# Patient Record
Sex: Female | Born: 1975 | Race: White | Hispanic: No | Marital: Married | Smoking: Former smoker
Health system: Southern US, Community
[De-identification: ages and names within clinical notes are randomized; demographics above are authoritative.]

## PROBLEM LIST (undated history)

## (undated) DIAGNOSIS — M1990 Unspecified osteoarthritis, unspecified site: Secondary | ICD-10-CM

## (undated) DIAGNOSIS — I951 Orthostatic hypotension: Secondary | ICD-10-CM

## (undated) DIAGNOSIS — F329 Major depressive disorder, single episode, unspecified: Secondary | ICD-10-CM

## (undated) DIAGNOSIS — F429 Obsessive-compulsive disorder, unspecified: Secondary | ICD-10-CM

## (undated) DIAGNOSIS — I498 Other specified cardiac arrhythmias: Secondary | ICD-10-CM

## (undated) DIAGNOSIS — F419 Anxiety disorder, unspecified: Secondary | ICD-10-CM

## (undated) DIAGNOSIS — R000 Tachycardia, unspecified: Secondary | ICD-10-CM

## (undated) DIAGNOSIS — T7840XA Allergy, unspecified, initial encounter: Secondary | ICD-10-CM

## (undated) DIAGNOSIS — J3081 Allergic rhinitis due to animal (cat) (dog) hair and dander: Secondary | ICD-10-CM

## (undated) DIAGNOSIS — F1020 Alcohol dependence, uncomplicated: Secondary | ICD-10-CM

## (undated) DIAGNOSIS — F1910 Other psychoactive substance abuse, uncomplicated: Secondary | ICD-10-CM

## (undated) DIAGNOSIS — F32A Depression, unspecified: Secondary | ICD-10-CM

## (undated) DIAGNOSIS — G90A Postural orthostatic tachycardia syndrome (POTS): Secondary | ICD-10-CM

## (undated) DIAGNOSIS — R011 Cardiac murmur, unspecified: Secondary | ICD-10-CM

## (undated) HISTORY — DX: Allergic rhinitis due to animal (cat) (dog) hair and dander: J30.81

## (undated) HISTORY — DX: Cardiac murmur, unspecified: R01.1

## (undated) HISTORY — DX: Depression, unspecified: F32.A

## (undated) HISTORY — DX: Alcohol dependence, uncomplicated: F10.20

## (undated) HISTORY — DX: Unspecified osteoarthritis, unspecified site: M19.90

## (undated) HISTORY — DX: Major depressive disorder, single episode, unspecified: F32.9

## (undated) HISTORY — PX: TONSILLECTOMY: SUR1361

## (undated) HISTORY — DX: Other psychoactive substance abuse, uncomplicated: F19.10

## (undated) HISTORY — DX: Allergy, unspecified, initial encounter: T78.40XA

---

## 2009-06-10 ENCOUNTER — Encounter: Payer: Self-pay | Admitting: Cardiology

## 2009-08-18 ENCOUNTER — Ambulatory Visit: Payer: Self-pay | Admitting: Cardiology

## 2009-08-18 DIAGNOSIS — R011 Cardiac murmur, unspecified: Secondary | ICD-10-CM

## 2009-08-18 HISTORY — DX: Cardiac murmur, unspecified: R01.1

## 2009-08-26 ENCOUNTER — Ambulatory Visit: Payer: Self-pay

## 2009-08-26 ENCOUNTER — Encounter: Payer: Self-pay | Admitting: Cardiology

## 2010-07-26 ENCOUNTER — Telehealth (INDEPENDENT_AMBULATORY_CARE_PROVIDER_SITE_OTHER): Payer: Self-pay | Admitting: *Deleted

## 2011-01-17 NOTE — Progress Notes (Signed)
  [employer] Request recieved, [employer] faxed this am. Erle Crocker  July 26, 2010 8:36 AM

## 2011-06-29 ENCOUNTER — Encounter (INDEPENDENT_AMBULATORY_CARE_PROVIDER_SITE_OTHER): Payer: BC Managed Care – PPO

## 2011-06-29 DIAGNOSIS — R002 Palpitations: Secondary | ICD-10-CM

## 2011-06-30 ENCOUNTER — Telehealth: Payer: Self-pay | Admitting: Cardiology

## 2011-06-30 NOTE — Telephone Encounter (Signed)
Pt needs to travel to [place] and is leaving on 7/18 and is wearing a monitor, advised I was not sure if it would work will check w/Ruth on Monday and call her back

## 2011-06-30 NOTE — Telephone Encounter (Signed)
Pt wants to know can she travel out of town with wearing the holter monitor

## 2011-07-03 NOTE — Telephone Encounter (Signed)
Left message again for pt to call back and that she should call the company about for monitor and whether it will work outside of the country

## 2011-07-03 NOTE — Telephone Encounter (Signed)
Left message for pt to call back to discuss.  She should call the monitor company to check with them.

## 2011-07-14 ENCOUNTER — Encounter: Payer: Self-pay | Admitting: Cardiology

## 2011-07-18 ENCOUNTER — Ambulatory Visit: Payer: Self-pay | Admitting: Cardiology

## 2011-07-19 ENCOUNTER — Encounter: Payer: Self-pay | Admitting: Cardiology

## 2011-07-19 ENCOUNTER — Ambulatory Visit (INDEPENDENT_AMBULATORY_CARE_PROVIDER_SITE_OTHER): Payer: BC Managed Care – PPO | Admitting: Cardiology

## 2011-07-19 VITALS — BP 110/77 | HR 74 | Ht 66.0 in | Wt 179.0 lb

## 2011-07-19 DIAGNOSIS — R011 Cardiac murmur, unspecified: Secondary | ICD-10-CM

## 2011-07-19 DIAGNOSIS — R55 Syncope and collapse: Secondary | ICD-10-CM

## 2011-07-19 NOTE — Patient Instructions (Signed)
Your physician recommends that you schedule a follow-up appointment with Dr Shirlee Latch after 07/26/2012 when monitor is complete--week of 8/13 or 8/20

## 2011-07-21 ENCOUNTER — Telehealth: Payer: Self-pay | Admitting: *Deleted

## 2011-07-21 NOTE — Progress Notes (Signed)
PCP: Dr. Riley Nearing  35 yo that I saw in the past for evaluation of cardiac murmur (had mitral valve prolapse but minimal mitral regurgitation) presents for followup after recent syncopal episode.  She also has palpitations.  Patient was standing in the aisle at [facility] a couple of weeks ago and began to feel severely lightheaded.  She passed out briefly and slumped to the ground.  She was admitted to the hospital in [facility].  Workup there was unremarkable with a relatively normal echo.  No arrhythmia but heart rate did drop into the 40s briefly.  She continues to feel her heart speed up and slow down seemingly at random.  Lightheadedness is associated with these palpitations. They can occur while she is seated or if she is standing: not clearly positional.  No vertigo-type symptoms.  She has had an event monitor on.  This has shown no significant arrhythmias.  When I checked orthostatics today, she was not orthostatic by blood pressure, but heart rate rose from 65 to 85 with standing and she felt palpitations.    Patient's exercise tolerance is good. She mountain bikes and is quite active.  No dyspnea or chest pain with exertion.    ECG:  NSR, normal  Allergies (verified):  1)  ! Amoxicillin 2)  ! * Naproxen 3)  ! * Shellfish 4)  ! Zithromax  Past Medical History: 1. Arthritis 2. Depression 3. Allergies 4. Cardiac murmur: Most recent echo in 7/12 at [facility] showed EF 60-65% with no significant valvular abnormalities.  5. Syncope 6. Possible POTS  Family History: No premature CAD.   Social History: Media planner  Tobacco Use - Former. Quit 09/2005 Alcohol Use - no Regular Exercise - yes, swimming, biking Hx of Drug Use in 1997  Full Time- Corporate Controller +caffeine Smoking Status:  quit Does Patient Exercise:  yes  Review of Systems        All systems reviewed and negative except as per HPI.    Current Outpatient Prescriptions  Medication Sig Dispense  Refill  . IBUPROFEN IB PO Take by mouth as needed.        . venlafaxine (EFFEXOR) 75 MG tablet Take 75 mg by mouth daily.          BP 110/77  Pulse 74  Ht 5\' 6"  (1.676 m)  Wt 179 lb (81.194 kg)  BMI 28.89 kg/m2 General: NAD Neck: No JVD, no thyromegaly or thyroid nodule.  Lungs: Clear to auscultation bilaterally with normal respiratory effort. CV: Nondisplaced PMI.  Heart regular S1/S2, no S3/S4, no murmur.  No peripheral edema.  No carotid bruit.  Normal pedal pulses.  Abdomen: Soft, nontender, no hepatosplenomegaly, no distention.  Neurologic: Alert and oriented x 3.  Psych: Normal affect. Extremities: No clubbing or cyanosis.

## 2011-07-21 NOTE — Telephone Encounter (Signed)
Dr Shirlee Latch received and reviewed [facility] End of Service Report enrollment period 06/29/11-06/30/11 no significant arrhythmia. Signed by Dr Shirlee Latch 07/03/11.

## 2011-07-23 DIAGNOSIS — R55 Syncope and collapse: Secondary | ICD-10-CM | POA: Insufficient documentation

## 2011-07-23 NOTE — Assessment & Plan Note (Signed)
Patient had a syncopal episode while standing in the grocery store and continues to have palpitations and lightheadedness that can occur while sitting or when standing.  Event monitor so far has shown no worrisome arrhythmias.  Echo was unremarkable.  Her heart rate did rise from 65=>85 with standing and she felt lightheaded though blood pressure did not fall.   Postural orthostatic tachycardia syndrome (POTS) is a definite possible cause for her symptoms.  I encouraged her to stay well-hydrated and to be liberal with salt use.  She has 2 weeks to go on the event monitor.  I will see her back after this is completed.

## 2011-07-23 NOTE — Assessment & Plan Note (Signed)
No significant murmur noted today.  Mild mitral valve prolapse seen on prior echo.

## 2011-08-09 ENCOUNTER — Ambulatory Visit (INDEPENDENT_AMBULATORY_CARE_PROVIDER_SITE_OTHER): Payer: BC Managed Care – PPO | Admitting: Cardiology

## 2011-08-09 ENCOUNTER — Encounter: Payer: Self-pay | Admitting: Cardiology

## 2011-08-09 VITALS — BP 112/75 | HR 74 | Resp 18 | Ht 66.0 in | Wt 180.1 lb

## 2011-08-09 DIAGNOSIS — R42 Dizziness and giddiness: Secondary | ICD-10-CM

## 2011-08-09 DIAGNOSIS — R002 Palpitations: Secondary | ICD-10-CM

## 2011-08-09 NOTE — Patient Instructions (Signed)
Lab is scheduled for 08/10/11 at 8:30am. This lab needs to be done in the morning. AM cortisol/TSH/Free T3/Free T4  785.1  Liberalize your salt intake.   Use compression stockings. You can get these at the pharmacy without a prescription.  Schedule an appointment to see Dr Shirlee Latch in 1 month.

## 2011-08-10 ENCOUNTER — Other Ambulatory Visit (INDEPENDENT_AMBULATORY_CARE_PROVIDER_SITE_OTHER): Payer: BC Managed Care – PPO | Admitting: *Deleted

## 2011-08-10 DIAGNOSIS — R002 Palpitations: Secondary | ICD-10-CM

## 2011-08-10 DIAGNOSIS — R42 Dizziness and giddiness: Secondary | ICD-10-CM | POA: Insufficient documentation

## 2011-08-10 HISTORY — DX: Dizziness and giddiness: R42

## 2011-08-10 LAB — CORTISOL: Cortisol, Plasma: 13.8 ug/dL

## 2011-08-10 NOTE — Progress Notes (Signed)
PCP: Dr. Riley Nearing  35 yo that I saw in the past for evaluation of cardiac murmur (had mitral valve prolapse but minimal mitral regurgitation) presents for followup after recent syncopal episode.  She also has palpitations.  Patient was standing in the aisle at [facility] several weeks ago and began to feel severely lightheaded.  She passed out briefly and slumped to the ground.  She was admitted to the hospital in [facility]  Workup there was unremarkable with a relatively normal echo.  No arrhythmia but heart rate did drop into the 40s briefly.  She continues to have "dizzy spells."  She is primarily lightheaded with standing.  She wore an event monitor that showed no significant arrhythmias.  When I checked orthostatics at last appointment, she was not orthostatic by blood pressure, but heart rate rose from 65 to 85 with standing and she felt palpitations.    Patient's exercise tolerance is good. She mountain bikes and is quite active.  No dyspnea or chest pain with exertion.    ECG:  NSR, normal  Allergies (verified):  1)  ! Amoxicillin 2)  ! * Naproxen 3)  ! * Shellfish 4)  ! Zithromax  Past Medical History: 1. Arthritis 2. Depression 3. Allergies 4. Cardiac murmur: Most recent echo in 7/12 at [facility] showed EF 60-65% with no significant valvular abnormalities.  5. Syncope 6. Possible POTS: HR rises with standing significantly with standing.  3 week event monitor in 7/12 was unremarkable.    Family History: No premature CAD.   Social History: Media planner  Tobacco Use - Former. Quit 09/2005 Alcohol Use - no Regular Exercise - yes, swimming, biking Hx of Drug Use in 1997  Full Time- Corporate Controller +caffeine Smoking Status:  quit Does Patient Exercise:  yes  Review of Systems        All systems reviewed and negative except as per HPI.    Current Outpatient Prescriptions  Medication Sig Dispense Refill  . IBUPROFEN IB PO Take by mouth as needed.         . venlafaxine (EFFEXOR) 75 MG tablet Take 75 mg by mouth daily.          BP 112/75  Pulse 74  Resp 18  Ht 5\' 6"  (1.676 m)  Wt 180 lb 1.9 oz (81.702 kg)  BMI 29.07 kg/m2 General: NAD Neck: No JVD, no thyromegaly or thyroid nodule.  Lungs: Clear to auscultation bilaterally with normal respiratory effort. CV: Nondisplaced PMI.  Heart regular S1/S2, no S3/S4, no murmur.  No peripheral edema.  No carotid bruit.  Normal pedal pulses.  Abdomen: Soft, nontender, no hepatosplenomegaly, no distention.  Neurologic: Alert and oriented x 3.  Psych: Normal affect. Extremities: No clubbing or cyanosis.

## 2011-08-10 NOTE — Assessment & Plan Note (Addendum)
Patient has lightheadedness and palpitations with standing.  HR rose about 20 bpm from lying to standing in the office without significant fall in BP.  Echo was unremarkable.  I suspect that she has postural tachycardia syndrome.  Prior to committing to this diagnosis, I will get an am cortisol, TSH, free T4, and free T3.  I would like her to start with liberalizing the sodium in her diet and using compression stockings.  I will see her back in a month.  If these steps have not helped her symptoms, I will consider trying fludrocortisone.

## 2011-08-14 ENCOUNTER — Telehealth: Payer: Self-pay | Admitting: Cardiology

## 2011-08-14 NOTE — Telephone Encounter (Signed)
I talked with pt about lab results done 08/10/11

## 2011-08-14 NOTE — Telephone Encounter (Signed)
Returning call back to nurse.  

## 2011-09-13 ENCOUNTER — Ambulatory Visit: Payer: BC Managed Care – PPO | Admitting: Cardiology

## 2011-09-18 ENCOUNTER — Encounter: Payer: Self-pay | Admitting: Cardiology

## 2011-12-01 ENCOUNTER — Ambulatory Visit (INDEPENDENT_AMBULATORY_CARE_PROVIDER_SITE_OTHER): Payer: BC Managed Care – PPO

## 2013-01-13 ENCOUNTER — Encounter: Payer: Self-pay | Admitting: *Deleted

## 2013-01-13 ENCOUNTER — Emergency Department (INDEPENDENT_AMBULATORY_CARE_PROVIDER_SITE_OTHER): Payer: BC Managed Care – PPO

## 2013-01-13 ENCOUNTER — Emergency Department (INDEPENDENT_AMBULATORY_CARE_PROVIDER_SITE_OTHER)
Admission: EM | Admit: 2013-01-13 | Discharge: 2013-01-13 | Disposition: A | Discharge status: Home / Self Care | Payer: Self-pay | Source: Home / Self Care | Attending: Family Medicine | Admitting: Family Medicine

## 2013-01-13 DIAGNOSIS — J0100 Acute maxillary sinusitis, unspecified: Secondary | ICD-10-CM

## 2013-01-13 DIAGNOSIS — J310 Chronic rhinitis: Secondary | ICD-10-CM

## 2013-01-13 DIAGNOSIS — R509 Fever, unspecified: Secondary | ICD-10-CM

## 2013-01-13 DIAGNOSIS — J3489 Other specified disorders of nose and nasal sinuses: Secondary | ICD-10-CM

## 2013-01-13 HISTORY — DX: Tachycardia, unspecified: R00.0

## 2013-01-13 HISTORY — DX: Orthostatic hypotension: I95.1

## 2013-01-13 HISTORY — DX: Postural orthostatic tachycardia syndrome (POTS): G90.A

## 2013-01-13 HISTORY — DX: Other specified cardiac arrhythmias: I49.8

## 2013-01-13 MED ORDER — CLINDAMYCIN HCL 300 MG PO CAPS: 300.0000 mg | ORAL_CAPSULE | Freq: Three times a day (TID) | ORAL | Status: DC

## 2013-01-13 MED ORDER — PREDNISONE 20 MG PO TABS: 20.0000 mg | ORAL_TABLET | Freq: Two times a day (BID) | ORAL | Status: DC

## 2013-01-13 NOTE — ED Notes (Signed)
Patient c/o sinus pain and congestion that she is having trouble getting to drain. Fatigue and body aches present at times. Taken Mucinex DM otc with minimal relief.

## 2013-01-13 NOTE — ED Provider Notes (Signed)
History     CSN: [id number]  Arrival date & time 01/13/13  4098   First MD Initiated Contact with Patient 01/13/13 1107      Chief Complaint  Patient presents with  . Sinus Problem      HPI Comments: Patient has a history of seasonal allergies and for about a month has had increased sneezing and head congestion.  One week ago she developed increased fatigue, myalgias, and frontal headache.  She has developed increased yellow sinus drainage, and has had chills for the past three days.  She has had no improvement from Mucinex DM, Afrin spray, and Advil. She states that she has Flonase at home but has not used it.  She notes that she is able to take clindamycin without adverse effect.  The history is provided by the patient.    Past Medical History  Diagnosis Date  . Arthritis   . Depressed   . Cat allergies   . Cardiac murmur   . POTS (postural orthostatic tachycardia syndrome)     Past Surgical History  Procedure Date  . Tonsillectomy     History reviewed. No pertinent family history.  History  Substance Use Topics  . Smoking status: Former Smoker    Quit date: 01/13/2003  . Smokeless tobacco: Current User    Types: Snuff     Comment: quit 10/06  . Alcohol Use: No    OB History    Grav Para Term Preterm Abortions TAB SAB Ect Mult Living                  Review of Systems No sore throat No cough No pleuritic pain No wheezing + nasal congestion + post-nasal drainage + sinus pain/pressure No itchy/red eyes No earache No hemoptysis No SOB No fever, + chills No nausea No vomiting No abdominal pain No diarrhea No urinary symptoms No skin rashes + fatigue No myalgias + headache   Allergies  Amoxicillin; Azithromycin; Naproxen; and Shellfish allergy  Home Medications   Current Outpatient Rx  Name  Route  Sig  Dispense  Refill  . CLINDAMYCIN HCL 300 MG PO CAPS   Oral   Take 1 capsule (300 mg total) by mouth 3 (three) times daily.   21  capsule   0   . IBUPROFEN IB PO   Oral   Take by mouth as needed.           Marland Kitchen PREDNISONE 20 MG PO TABS   Oral   Take 1 tablet (20 mg total) by mouth 2 (two) times daily.   10 tablet   0   . VENLAFAXINE HCL 75 MG PO TABS   Oral   Take 75 mg by mouth daily.             BP 121/80  Pulse 80  Temp 98.6 F (37 C) (Oral)  Resp 14  Ht 5\' 6"  (1.676 m)  Wt 207 lb (93.895 kg)  BMI 33.41 kg/m2  SpO2 98%  LMP 01/02/2013  Physical Exam Nursing notes and Vital Signs reviewed. Appearance:  Patient appears stated age, and in no acute distress.  Patient is obese (BMI 33.4) Eyes:  Pupils are equal, round, and reactive to light and accomodation.  Extraocular movement is intact.  Conjunctivae are not inflamed  Ears:  Canals normal.  Tympanic membranes normal.  Nose:  Moderately congested turbinates.  Bilateral maxillary sinus tenderness is present.  Pharynx:  Normal Neck:  Supple.  No adenopathy  Lungs:  Clear to auscultation.  Breath sounds are equal.  Heart:  Regular rate and rhythm without murmurs, rubs, or gallops.  Abdomen:  Nontender without masses or hepatosplenomegaly.  Bowel sounds are present.  No CVA or flank tenderness.  Extremities:  No edema.  No calf tenderness Skin:  No rash present.   ED Course  Procedures none    Dg Sinuses Complete  01/13/2013  *RADIOLOGY REPORT*  Clinical Data: Congestion, fever.  PARANASAL SINUSES - COMPLETE 3 + VIEW  Comparison: None.  Findings: No air fluid levels within the paranasal sinuses. Possible mucosal thickening within the maxillary sinuses bilaterally.  Visualized bony structures unremarkable.  IMPRESSION: Question maxillary mucoperiosteal thickening.  No air fluid levels.   Original Report Authenticated By: Charlett Nose, M.D.      1. Acute maxillary sinusitis   2. Rhinitis       MDM  Begin prednisone burst; Clindamycin for one week.  Begin Flonase (already has rx at home) Continue Mucinex D (guaifenesin with decongestant)  twice daily for congestion.  Increase fluid intake. May use Afrin nasal spray (or generic oxymetazoline) twice daily for about 5 days.  Also recommend using saline nasal spray several times daily and saline nasal irrigation (AYR is a common brand).  Use Flonase spray after Afrin and saline Stop all antihistamines for now, and other non-prescription cough/cold preparations. Followup with ENT if not improving        Lattie Haw, MD 01/17/13 1944

## 2013-05-15 ENCOUNTER — Emergency Department
Admission: EM | Admit: 2013-05-15 | Discharge: 2013-05-15 | Disposition: A | Discharge status: Home / Self Care | Payer: BC Managed Care – PPO | Source: Home / Self Care | Attending: Family Medicine | Admitting: Family Medicine

## 2013-05-15 ENCOUNTER — Encounter: Payer: Self-pay | Admitting: *Deleted

## 2013-05-15 DIAGNOSIS — J309 Allergic rhinitis, unspecified: Secondary | ICD-10-CM

## 2013-05-15 DIAGNOSIS — J069 Acute upper respiratory infection, unspecified: Secondary | ICD-10-CM

## 2013-05-15 DIAGNOSIS — J302 Other seasonal allergic rhinitis: Secondary | ICD-10-CM

## 2013-05-15 HISTORY — DX: Obsessive-compulsive disorder, unspecified: F42.9

## 2013-05-15 HISTORY — DX: Anxiety disorder, unspecified: F41.9

## 2013-05-15 MED ORDER — SULFAMETHOXAZOLE-TRIMETHOPRIM 800-160 MG PO TABS: 1.0000 | ORAL_TABLET | Freq: Two times a day (BID) | ORAL | Status: DC

## 2013-05-15 MED ORDER — FLUTICASONE PROPIONATE 50 MCG/ACT NA SUSP: NASAL | Status: DC

## 2013-05-15 MED ORDER — BENZONATATE 200 MG PO CAPS: 200.0000 mg | ORAL_CAPSULE | Freq: Every day | ORAL | Status: DC

## 2013-05-15 MED ORDER — PREDNISONE 20 MG PO TABS: 20.0000 mg | ORAL_TABLET | Freq: Two times a day (BID) | ORAL | Status: DC

## 2013-05-15 NOTE — ED Provider Notes (Signed)
History     CSN: [id number]  Arrival date & time 05/15/13  1310   First MD Initiated Contact with Patient 05/15/13 1340      Chief Complaint  Patient presents with  . Cough  . Generalized Body Aches  . Nasal Congestion       HPI Comments: Patient has a history of seasonal allergies.  3 days ago she developed fatigue, chills, and myalgias.  Last night the myalgias had improved, but this morning she developed increased sinus congestion not responding to Zyrtec.  She also noted a mild sore throat, and a slight cough today. Her seasonal rhinitis has improved in the past with Flonase nasal spray.  The history is provided by the patient.    Past Medical History  Diagnosis Date  . Arthritis   . Depressed   . Cat allergies   . Cardiac murmur   . POTS (postural orthostatic tachycardia syndrome)   . OCD (obsessive compulsive disorder)   . Anxiety     Past Surgical History  Procedure Laterality Date  . Tonsillectomy      Family History  Problem Relation Age of Onset  . Cancer Mother     skin    History  Substance Use Topics  . Smoking status: Former Smoker    Quit date: 01/13/2003  . Smokeless tobacco: Current User    Types: Snuff     Comment: quit 10/06  . Alcohol Use: No    OB History   Grav Para Term Preterm Abortions TAB SAB Ect Mult Living                  Review of Systems + mild sore throat each morning + cough today No pleuritic pain No wheezing + nasal congestion + post-nasal drainage + sinus pain/pressure No itchy/red eyes No earache No hemoptysis No SOB No fever, + chills No nausea No vomiting No abdominal pain No diarrhea No urinary symptoms No skin rashes + fatigue + myalgias No headache Used OTC meds without relief   Allergies  Amoxicillin; Azithromycin; Naproxen; and Shellfish allergy  Home Medications   Current Outpatient Rx  Name  Route  Sig  Dispense  Refill  . benzonatate (TESSALON) 200 MG capsule   Oral   Take 1  capsule (200 mg total) by mouth at bedtime.   12 capsule   0   . fluticasone (FLONASE) 50 MCG/ACT nasal spray      Place 2 sprays in each nostril once daily for allergy symptoms   16 g   2   . IBUPROFEN IB PO   Oral   Take by mouth as needed.           . predniSONE (DELTASONE) 20 MG tablet   Oral   Take 1 tablet (20 mg total) by mouth 2 (two) times daily.   10 tablet   0   . sulfamethoxazole-trimethoprim (BACTRIM DS,SEPTRA DS) 800-160 MG per tablet   Oral   Take 1 tablet by mouth 2 (two) times daily. (Rx void after 05/23/13)   14 tablet   0   . venlafaxine (EFFEXOR) 75 MG tablet   Oral   Take 75 mg by mouth daily.             BP 138/85  Pulse 91  Temp(Src) 97.9 F (36.6 C) (Oral)  Resp 18  Wt 200 lb (90.719 kg)  BMI 32.3 kg/m2  SpO2 100%  LMP 04/24/2013  Physical Exam Nursing notes and Vital Signs  reviewed. Appearance:  Patient appears stated age, and in no acute distress.  Patient is obese (BMI 32.3) Eyes:  Pupils are equal, round, and reactive to light and accomodation.  Extraocular movement is intact.  Conjunctivae are not inflamed  Ears:  Canals normal.  Tympanic membranes normal.  Nose:  Mildly congested turbinates.  No sinus tenderness.   Pharynx:  Normal Neck:  Supple.  Non-tender but prominent shotty posterior nodes are palpated bilaterally  Lungs:  Clear to auscultation.  Breath sounds are equal.  Heart:  Regular rate and rhythm without murmurs, rubs, or gallops.  Abdomen:  Nontender without masses or hepatosplenomegaly.  Bowel sounds are present.  No CVA or flank tenderness.  Extremities:  No edema.  Skin:  No rash present.   ED Course  Procedures  none      1. Acute upper respiratory infections of unspecified site; suspect early viral URI  2. Seasonal rhinitis       MDM  There is no evidence of bacterial infection today.   Begin Flonase nasal spray. Take plain Mucinex (guaifenesin) twice daily for cough and congestion.  Add Sudafed  as needed for sinus congestion.  Increase fluid intake, rest. May continue Afrin nasal spray (or generic oxymetazoline) twice daily for about 5 days.  Also recommend using saline nasal spray several times daily and saline nasal irrigation (AYR is a common brand).  Use Flonase spray after decongestant spray and saline rinse. Stop all antihistamines for now, and other non-prescription cough/cold preparations. Begin antibiotic if not improving about one week or if persistent fever develops (Given a prescription to hold, with an expiration date). Follow-up with family doctor if not improving about 10 days.        Lattie Haw, MD 05/15/13 [phone]

## 2013-05-15 NOTE — ED Notes (Signed)
Pt c/o body aches, chills, nasal congestion, and cough x 3 days. She has taken Advil, Mucinex and Alka Seltzer with no relief.

## 2013-05-29 ENCOUNTER — Encounter: Payer: Self-pay | Admitting: *Deleted

## 2013-05-29 ENCOUNTER — Emergency Department
Admission: EM | Admit: 2013-05-29 | Discharge: 2013-05-29 | Disposition: A | Discharge status: Home / Self Care | Payer: BC Managed Care – PPO | Source: Home / Self Care | Attending: Family Medicine | Admitting: Family Medicine

## 2013-05-29 DIAGNOSIS — W57XXXA Bitten or stung by nonvenomous insect and other nonvenomous arthropods, initial encounter: Secondary | ICD-10-CM

## 2013-05-29 DIAGNOSIS — R21 Rash and other nonspecific skin eruption: Secondary | ICD-10-CM

## 2013-05-29 DIAGNOSIS — T148 Other injury of unspecified body region: Secondary | ICD-10-CM

## 2013-05-29 MED ORDER — METHYLPREDNISOLONE SODIUM SUCC 125 MG IJ SOLR
125.0000 mg | Freq: Once | INTRAMUSCULAR | Status: AC
  Administered 2013-05-29: 125 mg via INTRAMUSCULAR

## 2013-05-29 MED ORDER — HYDROXYZINE HCL 25 MG PO TABS: 25.0000 mg | ORAL_TABLET | Freq: Three times a day (TID) | ORAL | Status: DC | PRN

## 2013-05-29 MED ORDER — DOXYCYCLINE HYCLATE 100 MG PO CAPS: ORAL_CAPSULE | ORAL | Status: DC

## 2013-05-29 MED ORDER — PREDNISONE 50 MG PO TABS: ORAL_TABLET | ORAL | Status: DC

## 2013-05-29 NOTE — ED Provider Notes (Addendum)
History     CSN: [id number]  Arrival date & time 05/29/13  1109   First MD Initiated Contact with Patient 05/29/13 1114      Chief Complaint  Patient presents with  . Insect Bite   HPI  HPI  This patient complains of a RASH  Location: L lower abdomen   Onset: yesterday evening  Course: Pt was riding bike on trail when she felt insect fly under her shirt and then sting her. Had localized area of redness that seemed to worsen. Went to [facility] where her sister works as a Engineer, civil (consulting). Gave her prednisone for treatment. Pt states this helped mildly. Pt states that when she woke, area was more red. Area has been intensily pruritic since initial sting. Minimal tenderness. Unsure of type of insect that caused sting. Denies any prior allergy to bees.   Self-treated with: prednisone as above   Improvement with treatment: mild   History  Itching: yes, intense   Tenderness: mild   New medications/antibiotics: no  Pet exposure: no  Recent travel or tropical exposure: no  New soaps, shampoos, detergent, clothing: no  Tick/insect exposure: no  Chemical Exposure: no  Red Flags  Feeling ill: no  Fever: no  Facial/tongue swelling/difficulty breathing: no  Diabetic or immunocompromised: no    Past Medical History  Diagnosis Date  . Arthritis   . Depressed   . Cat allergies   . Cardiac murmur   . POTS (postural orthostatic tachycardia syndrome)   . OCD (obsessive compulsive disorder)   . Anxiety     Past Surgical History  Procedure Laterality Date  . Tonsillectomy      Family History  Problem Relation Age of Onset  . Cancer Mother     skin    History  Substance Use Topics  . Smoking status: Former Smoker    Quit date: 01/13/2003  . Smokeless tobacco: Current User    Types: Snuff     Comment: quit 10/06  . Alcohol Use: No    OB History   Grav Para Term Preterm Abortions TAB SAB Ect Mult Living                  Review of Systems  All other systems reviewed and are  negative.    Allergies  Amoxicillin; Azithromycin; Naproxen; and Shellfish allergy  Home Medications   Current Outpatient Rx  Name  Route  Sig  Dispense  Refill  . fluticasone (FLONASE) 50 MCG/ACT nasal spray      Place 2 sprays in each nostril once daily for allergy symptoms   16 g   2   . IBUPROFEN IB PO   Oral   Take by mouth as needed.           . venlafaxine (EFFEXOR) 75 MG tablet   Oral   Take 75 mg by mouth daily.             BP 119/84  Pulse 84  Temp(Src) 98.3 F (36.8 C) (Oral)  Resp 14  Wt 199 lb (90.266 kg)  BMI 32.13 kg/m2  SpO2 98%  LMP 04/24/2013  Physical Exam  Constitutional: She appears well-developed and well-nourished.  HENT:  Head: Normocephalic and atraumatic.  Eyes: Conjunctivae are normal. Pupils are equal, round, and reactive to light.  Neck: Normal range of motion.  Cardiovascular: Normal rate, regular rhythm and normal heart sounds.   Pulmonary/Chest: Effort normal.  Abdominal: Soft.  Musculoskeletal: Normal range of motion.  Neurological: She is  alert.  Skin: Rash noted.    ED Course  Procedures (including critical care time)  Labs Reviewed - No data to display No results found.   1. Insect bite   2. Rash       MDM  Suspect secondary pruritic reaction from insect bite.  Will treat with solumedrol and prednisone.  Atarax for itching.  Start doxy if rash worsens despite treatment.  Discussed infectious and derm red flags.  Follow up as needed.     The patient and/or caregiver has been counseled thoroughly with regard to treatment plan and/or medications prescribed including dosage, schedule, interactions, rationale for use, and possible side effects and they verbalize understanding. Diagnoses and expected course of recovery discussed and will return if not improved as expected or if the condition worsens. Patient and/or caregiver verbalized understanding.             Doree Albee, MD 05/29/13  1146  Doree Albee, MD 05/29/13 1154

## 2013-05-29 NOTE — ED Notes (Signed)
Chelsea Wells reports being bitten by an insect last night @ 730pm on the left side of her abdomen. At 815pm she took one oral dose of prednisone. Site has surrounding redness.  She c/o being unable to take a deep breath but otherwise is asymptomatic.

## 2014-11-02 ENCOUNTER — Emergency Department (INDEPENDENT_AMBULATORY_CARE_PROVIDER_SITE_OTHER): Payer: BC Managed Care – PPO

## 2014-11-02 ENCOUNTER — Encounter: Payer: Self-pay | Admitting: Emergency Medicine

## 2014-11-02 ENCOUNTER — Emergency Department
Admission: EM | Admit: 2014-11-02 | Discharge: 2014-11-02 | Disposition: A | Discharge status: Home / Self Care | Payer: BC Managed Care – PPO | Source: Home / Self Care | Attending: Emergency Medicine | Admitting: Emergency Medicine

## 2014-11-02 DIAGNOSIS — S6720XA Crushing injury of unspecified hand, initial encounter: Secondary | ICD-10-CM

## 2014-11-02 DIAGNOSIS — T1490XA Injury, unspecified, initial encounter: Secondary | ICD-10-CM

## 2014-11-02 DIAGNOSIS — M79641 Pain in right hand: Secondary | ICD-10-CM

## 2014-11-02 NOTE — ED Notes (Signed)
Reports catching 3 fingers of right hand in car door last week; pain in index, middle and ring fingers has not resolved and desires xray.

## 2014-11-02 NOTE — ED Provider Notes (Signed)
CSN: [id number]     Arrival date & time 11/02/14  1720 History   First MD Initiated Contact with Patient 11/02/14 1750     Chief Complaint  Patient presents with  . Hand Pain   (Consider location/radiation/quality/duration/timing/severity/associated sxs/prior Treatment) Patient is a 38 y.o. female presenting with hand pain. The history is provided by the patient. No language interpreter was used.  Hand Pain This is a new problem. The current episode started today. The problem occurs constantly. The problem has been unchanged. Nothing aggravates the symptoms. She has tried nothing for the symptoms. The treatment provided moderate relief.  Pt complains  Of hitting hand with a door a week ago.  Pt reports she still has pain  Past Medical History  Diagnosis Date  . Arthritis   . Depressed   . Cat allergies   . Cardiac murmur   . POTS (postural orthostatic tachycardia syndrome)   . OCD (obsessive compulsive disorder)   . Anxiety    Past Surgical History  Procedure Laterality Date  . Tonsillectomy     Family History  Problem Relation Age of Onset  . Cancer Mother     skin   History  Substance Use Topics  . Smoking status: Former Smoker    Quit date: 01/13/2003  . Smokeless tobacco: Current User    Types: Snuff     Comment: quit 10/06  . Alcohol Use: No   OB History    No data available     Review of Systems  Skin: Positive for wound.  All other systems reviewed and are negative.   Allergies  Amoxicillin; Azithromycin; Naproxen; and Shellfish allergy  Home Medications   Prior to Admission medications   Medication Sig Start Date End Date Taking? Authorizing Provider  doxycycline (VIBRAMYCIN) 100 MG capsule 1 tab twice daily x 7 days. Use if rash worsens 05/29/13   Shanda Howells, MD  fluticasone [facility]) 50 MCG/ACT nasal spray Place 2 sprays in each nostril once daily for allergy symptoms 05/15/13   Kandra Nicolas, MD  hydrOXYzine (ATARAX/VISTARIL) 25 MG tablet Take  1 tablet (25 mg total) by mouth 3 (three) times daily as needed for itching. 05/29/13   Shanda Howells, MD  IBUPROFEN IB PO Take by mouth as needed.      Historical Provider, MD  predniSONE (DELTASONE) 50 MG tablet 1 tab daily x 4 days 05/29/13   Shanda Howells, MD  venlafaxine [facility]) 75 MG tablet Take 75 mg by mouth daily.      Historical Provider, MD   BP 102/70 mmHg  Pulse 92  Temp(Src) 97.8 F (36.6 C) (Oral)  Resp 16  Ht 5\' 6"  (1.676 m)  Wt 170 lb (77.111 kg)  BMI 27.45 kg/m2  SpO2 100% Physical Exam  Constitutional: She is oriented to person, place, and time. She appears well-developed and well-nourished.  HENT:  Head: Normocephalic.  Musculoskeletal: She exhibits tenderness.  From fingers,  Tender index middle and ring finger,  Slight swelling nv and ns intact  Neurological: She is alert and oriented to person, place, and time. She has normal reflexes.  Skin: Skin is warm.  Psychiatric: She has a normal mood and affect.  Nursing note and vitals reviewed.   ED Course  Procedures (including critical care time) Labs Review Labs Reviewed - No data to display  Imaging Review Dg Hand Complete Right  11/02/2014   CLINICAL DATA:  Injured right hand. Closed in a car door 2 weeks ago.  EXAM: RIGHT HAND -  COMPLETE 3+ VIEW  COMPARISON:  None.  FINDINGS: The joint spaces are maintained.  No acute fractures identified.  IMPRESSION: No acute bony findings.   Electronically Signed   By: Kalman Jewels M.D.   On: 11/02/2014 17:58     MDM   1. Crushing injury of hand and fingers, initial encounter   2. Injury     Pt counseled on results.  Advised of healing and crush injuries AVS   Fransico Meadow, PA-C 11/03/14 1643

## 2014-11-02 NOTE — Discharge Instructions (Signed)
Crush Injury, Fingers or Toes °A crush injury to the fingers or toes means the tissues have been damaged by being squeezed (compressed). There will be bleeding into the tissues and swelling. Often, blood will collect under the skin. When this happens, the skin on the finger often dies and may slough off (shed) 1 week to 10 days later. Usually, new skin is growing underneath. If the injury has been too severe and the tissue does not survive, the damaged tissue may begin to turn black over several days.  °Wounds which occur because of the crushing may be stitched (sutured) shut. However, crush injuries are more likely to become infected than other injuries. These wounds may not be closed as tightly as other types of cuts to prevent infection. Nails involved are often lost. These usually grow back over several weeks.  °DIAGNOSIS °X-rays may be taken to see if there is any injury to the bones. °TREATMENT °Broken bones (fractures) may be treated with splinting, depending on the fracture. Often, no treatment is required for fractures of the last bone in the fingers or toes. °HOME CARE INSTRUCTIONS  °· The crushed part should be raised (elevated) above the heart or center of the chest as much as possible for the first several days or as directed. This helps with pain and lessens swelling. Less swelling increases the chances that the crushed part will survive. °· Put ice on the injured area. °¨ Put ice in a plastic bag. °¨ Place a towel between your skin and the bag. °¨ Leave the ice on for 15-20 minutes, 03-04 times a day for the first 2 days. °· Only take over-the-counter or prescription medicines for pain, discomfort, or fever as directed by your caregiver. °· Use your injured part only as directed. °· Change your bandages (dressings) as directed. °· Keep all follow-up appointments as directed by your caregiver. Not keeping your appointment could result in a chronic or permanent injury, pain, and disability. If there is  any problem keeping the appointment, you must call to reschedule. °SEEK IMMEDIATE MEDICAL CARE IF:  °· There is redness, swelling, or increasing pain in the wound area. °· Pus is coming from the wound. °· You have a fever. °· You notice a bad smell coming from the wound or dressing. °· The edges of the wound do not stay together after the sutures have been removed. °· You are unable to move the injured finger or toe. °MAKE SURE YOU:  °· Understand these instructions. °· Will watch your condition. °· Will get help right away if you are not doing well or get worse. °Document Released: 12/04/2005 Document Revised: 02/26/2012 Document Reviewed: 04/21/2011 °[employer]® Patient Information ©2015 [employer]. This information is not intended to replace advice given to you by your health care provider. Make sure you discuss any questions you have with your health care provider. ° °

## 2014-11-25 ENCOUNTER — Emergency Department
Admission: EM | Admit: 2014-11-25 | Discharge: 2014-11-25 | Disposition: A | Discharge status: Home / Self Care | Payer: BC Managed Care – PPO | Source: Home / Self Care | Attending: Family Medicine | Admitting: Family Medicine

## 2014-11-25 ENCOUNTER — Encounter: Payer: Self-pay | Admitting: Emergency Medicine

## 2014-11-25 DIAGNOSIS — J309 Allergic rhinitis, unspecified: Secondary | ICD-10-CM

## 2014-11-25 DIAGNOSIS — B9789 Other viral agents as the cause of diseases classified elsewhere: Principal | ICD-10-CM

## 2014-11-25 DIAGNOSIS — J3089 Other allergic rhinitis: Secondary | ICD-10-CM

## 2014-11-25 DIAGNOSIS — J069 Acute upper respiratory infection, unspecified: Secondary | ICD-10-CM

## 2014-11-25 MED ORDER — BENZONATATE 200 MG PO CAPS: 200.0000 mg | ORAL_CAPSULE | Freq: Every day | ORAL | Status: DC

## 2014-11-25 MED ORDER — PREDNISONE 20 MG PO TABS: 20.0000 mg | ORAL_TABLET | Freq: Two times a day (BID) | ORAL | Status: DC

## 2014-11-25 MED ORDER — SULFAMETHOXAZOLE-TRIMETHOPRIM 800-160 MG PO TABS: 1.0000 | ORAL_TABLET | Freq: Two times a day (BID) | ORAL | Status: DC

## 2014-11-25 NOTE — Discharge Instructions (Signed)
Take plain Mucinex (1200 mg guaifenesin) twice daily for cough and congestion.  May add Sudafed for sinus congestion.   Increase fluid intake, rest. May use Afrin nasal spray (or generic oxymetazoline) twice daily for about 5 days.  Also recommend using saline nasal spray several times daily and saline nasal irrigation (AYR is a common brand).  Continue Flonase nasal spray as prescribed. Try warm salt water gargles for sore throat.  Stop all antihistamines for now, and other non-prescription cough/cold preparations. Begin Bactrim DS if not improving about one week or if persistent fever develops   Follow-up with family doctor if not improving about10 days.

## 2014-11-25 NOTE — ED Provider Notes (Signed)
CSN: [id number]     Arrival date & time 11/25/14  1614 History   First MD Initiated Contact with Patient 11/25/14 1628     Chief Complaint  Patient presents with  . Nasal Congestion  . Cough  . Generalized Body Aches  . Emesis      HPI Comments: Two days ago patient developed sinus congestion, myalgias, fatigue, mild sore throat, nausea/vomiting, fever to 102.1, and mild cough. She has a history of perennial allergic rhinitis and normally takes Zyrtec and Flonase nasal spray.  The history is provided by the patient.    Past Medical History  Diagnosis Date  . Arthritis   . Depressed   . Cat allergies   . Cardiac murmur   . POTS (postural orthostatic tachycardia syndrome)   . OCD (obsessive compulsive disorder)   . Anxiety    Past Surgical History  Procedure Laterality Date  . Tonsillectomy     Family History  Problem Relation Age of Onset  . Cancer Mother     skin   History  Substance Use Topics  . Smoking status: Former Smoker    Quit date: 01/13/2003  . Smokeless tobacco: Current User    Types: Snuff     Comment: quit 10/06  . Alcohol Use: No   OB History    No data available     Review of Systems + sore throat + cough No pleuritic pain No wheezing + nasal congestion + post-nasal drainage No sinus pain/pressure No itchy/red eyes No earache No hemoptysis No SOB + fever, + chills No nausea + vomiting, resolved No abdominal pain No diarrhea No urinary symptoms No skin rash + fatigue + myalgias No headache Used OTC meds without relief  Allergies  Amoxicillin; Azithromycin; Naproxen; and Shellfish allergy  Home Medications   Prior to Admission medications   Medication Sig Start Date End Date Taking? Authorizing Provider  benzonatate (TESSALON) 200 MG capsule Take 1 capsule (200 mg total) by mouth at bedtime. Take as needed for cough 11/25/14   Kandra Nicolas, MD  fluticasone [facility]) 50 MCG/ACT nasal spray Place 2 sprays in each nostril once  daily for allergy symptoms 05/15/13   Kandra Nicolas, MD  hydrOXYzine (ATARAX/VISTARIL) 25 MG tablet Take 1 tablet (25 mg total) by mouth 3 (three) times daily as needed for itching. 05/29/13   Shanda Howells, MD  IBUPROFEN IB PO Take by mouth as needed.      Historical Provider, MD  predniSONE (DELTASONE) 20 MG tablet Take 1 tablet (20 mg total) by mouth 2 (two) times daily. Take with food. 11/25/14   Kandra Nicolas, MD  sulfamethoxazole-trimethoprim (BACTRIM DS,SEPTRA DS) 800-160 MG per tablet Take 1 tablet by mouth 2 (two) times daily. (Rx void after 12/03/14) 11/25/14   Kandra Nicolas, MD  venlafaxine (EFFEXOR) 75 MG tablet Take 75 mg by mouth daily.      Historical Provider, MD   BP 114/82 mmHg  Pulse 97  Temp(Src) 98 F (36.7 C) (Oral)  Resp 16  Ht 5\' 6"  (1.676 m)  Wt 170 lb (77.111 kg)  BMI 27.45 kg/m2  SpO2 99% Physical Exam Nursing notes and Vital Signs reviewed. Appearance:  Patient appears healthy, stated age, and in no acute distress Eyes:  Pupils are equal, round, and reactive to light and accomodation.  Extraocular movement is intact.  Conjunctivae are not inflamed  Ears:  Canals normal.  Tympanic membranes normal.  Nose:  Congested turbinates.  No sinus tenderness.  Pharynx:  Normal Neck:  Supple.  Nontender enlarged posterior nodes are palpated bilaterally  Lungs:  Clear to auscultation.  Breath sounds are equal.  Heart:  Regular rate and rhythm without murmurs, rubs, or gallops.  Abdomen:  Nontender without masses or hepatosplenomegaly.  Bowel sounds are present.  No CVA or flank tenderness.  Extremities:  No edema.  No calf tenderness Skin:  No rash present.   ED Course  Procedures  none   MDM   1. Viral URI with cough   2. Perennial allergic rhinitis    There is no evidence of bacterial infection today.  Treat symptomatically for now  Prescription written for Benzonatate (Tessalon) to take at bedtime for night-time cough.  Begin prednisone burst. Take plain  Mucinex (1200 mg guaifenesin) twice daily for cough and congestion.  May add Sudafed for sinus congestion.   Increase fluid intake, rest. May use Afrin nasal spray (or generic oxymetazoline) twice daily for about 5 days.  Also recommend using saline nasal spray several times daily and saline nasal irrigation (AYR is a common brand).  Continue Flonase nasal spray as prescribed. Try warm salt water gargles for sore throat.  Stop all antihistamines for now, and other non-prescription cough/cold preparations. Begin Bactrim DS if not improving about one week or if persistent fever develops (Given a prescription to hold, with an expiration date)  Follow-up with family doctor if not improving about10 days.     Kandra Nicolas, MD 11/27/14 [id number]

## 2014-11-25 NOTE — ED Notes (Signed)
Reports onset of congestion, cough, ache, sore throat and occasional vomiting 3 days ago.

## 2016-04-25 ENCOUNTER — Emergency Department
Admission: EM | Admit: 2016-04-25 | Discharge: 2016-04-25 | Disposition: A | Discharge status: Home / Self Care | Payer: BLUE CROSS/BLUE SHIELD | Source: Home / Self Care | Attending: Emergency Medicine | Admitting: Emergency Medicine

## 2016-04-25 ENCOUNTER — Encounter: Payer: Self-pay | Admitting: *Deleted

## 2016-04-25 DIAGNOSIS — J0120 Acute ethmoidal sinusitis, unspecified: Secondary | ICD-10-CM | POA: Diagnosis not present

## 2016-04-25 MED ORDER — CLINDAMYCIN HCL 150 MG PO CAPS: 150.0000 mg | ORAL_CAPSULE | Freq: Four times a day (QID) | ORAL | Status: DC

## 2016-04-25 MED ORDER — PREDNISONE 10 MG PO TABS: ORAL_TABLET | ORAL | Status: DC

## 2016-04-25 NOTE — Discharge Instructions (Signed)

## 2016-04-25 NOTE — ED Provider Notes (Signed)
CSN: [id number]     Arrival date & time 04/25/16  1334 History   First MD Initiated Contact with Patient 04/25/16 1407     Chief Complaint  Patient presents with  . Facial Pain  . Sinus Problem   (Consider location/radiation/quality/duration/timing/severity/associated sxs/prior Treatment) Patient is a 40 y.o. female presenting with sinus complaint. The history is provided by the patient. No language interpreter was used.  Sinus Problem This is a new problem. Episode onset: 4 days. The problem occurs constantly. The problem has been gradually worsening. Associated symptoms include headaches. Nothing aggravates the symptoms. Nothing relieves the symptoms. She has tried nothing for the symptoms. The treatment provided no relief.  Pt complains of sinus congestion and facial pain.    Past Medical History  Diagnosis Date  . Arthritis   . Depressed   . Cat allergies   . Cardiac murmur   . POTS (postural orthostatic tachycardia syndrome)   . OCD (obsessive compulsive disorder)   . Anxiety    Past Surgical History  Procedure Laterality Date  . Tonsillectomy     Family History  Problem Relation Age of Onset  . Cancer Mother     skin   Social History  Substance Use Topics  . Smoking status: Former Smoker    Quit date: 01/13/2003  . Smokeless tobacco: Current User    Types: Snuff     Comment: quit 10/06  . Alcohol Use: No   OB History    No data available     Review of Systems  Neurological: Positive for headaches.  All other systems reviewed and are negative.   Allergies  Amoxicillin; Azithromycin; Naproxen; and Shellfish allergy  Home Medications   Prior to Admission medications   Medication Sig Start Date End Date Taking? Authorizing Provider  clindamycin (CLEOCIN) 150 MG capsule Take 1 capsule (150 mg total) by mouth every 6 (six) hours. 04/25/16   Fransico Meadow, PA-C  fluticasone [facility]) 50 MCG/ACT nasal spray Place 2 sprays in each nostril once daily for allergy  symptoms 05/15/13   Kandra Nicolas, MD  IBUPROFEN IB PO Take by mouth as needed.      Historical Provider, MD  predniSONE (DELTASONE) 10 MG tablet 6,5,4,3,2,1 taper 04/25/16   Fransico Meadow, PA-C  venlafaxine (EFFEXOR) 75 MG tablet Take 75 mg by mouth daily.      Historical Provider, MD   Meds Ordered and Administered this Visit  Medications - No data to display  BP 117/79 mmHg  Pulse 94  Temp(Src) 98.4 F (36.9 C) (Oral)  Wt 193 lb (87.544 kg)  SpO2 96%  LMP 04/18/2016 No data found.   Physical Exam  Constitutional: She is oriented to person, place, and time. She appears well-developed and well-nourished.  HENT:  Head: Normocephalic and atraumatic.  Nose: Nose normal.  Mouth/Throat: Posterior oropharyngeal erythema present.  Tender maxillary sinuses,  Eyes: Conjunctivae and EOM are normal. Pupils are equal, round, and reactive to light.  Neck: Normal range of motion. Neck supple.  Cardiovascular: Normal rate.   Pulmonary/Chest: Effort normal.  Abdominal: Soft.  Musculoskeletal: Normal range of motion.  Neurological: She is alert and oriented to person, place, and time.  Skin: Skin is warm and dry.  Psychiatric: She has a normal mood and affect.    ED Course  Procedures (including critical care time)  Labs Review Labs Reviewed - No data to display  Imaging Review No results found.   Visual Acuity Review  Right Eye Distance:  Left Eye Distance:   Bilateral Distance:    Right Eye Near:   Left Eye Near:    Bilateral Near:         MDM  Pt reports she has a history of sinusitis.  Pt reports she normally takes clindamycin.  Pt has multiple allergies.  Pt also reports normally requires prednisone.     1. Acute ethmoidal sinusitis, recurrence not specified    Meds ordered this encounter  Medications  . DISCONTD: clindamycin (CLEOCIN) 150 MG capsule    Sig: Take 1 capsule (150 mg total) by mouth every 6 (six) hours.    Dispense:  28 capsule    Refill:  0     Order Specific Question:  Supervising Provider    Answer:  Burnett Harry, DAVID [[id number]]  . DISCONTD: predniSONE (DELTASONE) 10 MG tablet    Sig: 6,5,4,3,2,1 taper    Dispense:  21 tablet    Refill:  0    Order Specific Question:  Supervising Provider    AnswerBurnett Harry, DAVID [[id number]]  . predniSONE (DELTASONE) 10 MG tablet    Sig: 6,5,4,3,2,1 taper    Dispense:  21 tablet    Refill:  0    Order Specific Question:  Supervising Provider    AnswerBurnett Harry, DAVID [[id number]]  . clindamycin (CLEOCIN) 150 MG capsule    Sig: Take 1 capsule (150 mg total) by mouth every 6 (six) hours.    Dispense:  28 capsule    Refill:  0    Order Specific Question:  Supervising Provider    Answer:  Burnett Harry, DAVID [[id number]]  An After Visit Summary was printed and given to the patient.    [place], PA-C 04/25/16 1456

## 2016-04-25 NOTE — ED Notes (Signed)
Pt c/o 4 days of facial pain, nasal congestion and HA. Afebrile but c/o some aches. Taken Sudafed and IBF.

## 2017-11-28 ENCOUNTER — Encounter (HOSPITAL_BASED_OUTPATIENT_CLINIC_OR_DEPARTMENT_OTHER): Payer: Self-pay

## 2017-11-28 ENCOUNTER — Emergency Department (HOSPITAL_BASED_OUTPATIENT_CLINIC_OR_DEPARTMENT_OTHER): Payer: BLUE CROSS/BLUE SHIELD

## 2017-11-28 ENCOUNTER — Emergency Department (HOSPITAL_BASED_OUTPATIENT_CLINIC_OR_DEPARTMENT_OTHER)
Admission: EM | Admit: 2017-11-28 | Discharge: 2017-11-28 | Disposition: A | Discharge status: Home / Self Care | Payer: BLUE CROSS/BLUE SHIELD | Attending: Emergency Medicine | Admitting: Emergency Medicine

## 2017-11-28 ENCOUNTER — Other Ambulatory Visit: Payer: Self-pay

## 2017-11-28 DIAGNOSIS — F17220 Nicotine dependence, chewing tobacco, uncomplicated: Secondary | ICD-10-CM | POA: Diagnosis not present

## 2017-11-28 DIAGNOSIS — R51 Headache: Secondary | ICD-10-CM | POA: Diagnosis not present

## 2017-11-28 DIAGNOSIS — Z79899 Other long term (current) drug therapy: Secondary | ICD-10-CM | POA: Insufficient documentation

## 2017-11-28 DIAGNOSIS — R519 Headache, unspecified: Secondary | ICD-10-CM

## 2017-11-28 DIAGNOSIS — H538 Other visual disturbances: Secondary | ICD-10-CM | POA: Diagnosis present

## 2017-11-28 DIAGNOSIS — F17200 Nicotine dependence, unspecified, uncomplicated: Secondary | ICD-10-CM | POA: Diagnosis not present

## 2017-11-28 MED ORDER — METOCLOPRAMIDE HCL 5 MG/ML IJ SOLN
10.0000 mg | Freq: Once | INTRAMUSCULAR | Status: AC
  Administered 2017-11-28: 10 mg via INTRAVENOUS
  Filled 2017-11-28: qty 2

## 2017-11-28 MED ORDER — DEXAMETHASONE SODIUM PHOSPHATE 10 MG/ML IJ SOLN
10.0000 mg | Freq: Once | INTRAMUSCULAR | Status: AC
  Administered 2017-11-28: 10 mg via INTRAVENOUS
  Filled 2017-11-28: qty 1

## 2017-11-28 MED ORDER — DIPHENHYDRAMINE HCL 50 MG/ML IJ SOLN
25.0000 mg | Freq: Once | INTRAMUSCULAR | Status: AC
  Administered 2017-11-28: 25 mg via INTRAVENOUS
  Filled 2017-11-28: qty 1

## 2017-11-28 MED ORDER — SODIUM CHLORIDE 0.9 % IV BOLUS (SEPSIS)
1000.0000 mL | Freq: Once | INTRAVENOUS | Status: AC
  Administered 2017-11-28: 1000 mL via INTRAVENOUS

## 2017-11-28 NOTE — ED Triage Notes (Signed)
Pt states she at [facility] when had sudden onset blurred vision then started seeing flashes-HA started with vision changes-NAD-steady gait

## 2017-11-28 NOTE — ED Provider Notes (Signed)
[facility] EMERGENCY DEPARTMENT Provider Note   CSN: [id number] Arrival date & time: 11/28/17  1428     History   Chief Complaint Chief Complaint  Patient presents with  . Visual Field Change    HPI Chelsea Wells is a 41 y.o. female.  Chelsea Wells is a 41 y.o. Female who presents to the ED complaining of visual changes starting today.  Patient reports she was at the [facility] and after leaving she had blurry vision to both of her eyes.  She reports this lasted for about 3 minutes and then her blurry vision improved to only having blurry peripheral vision.  She reports it seems there is like a halo around her vision and she can see clearly looking forward but on the periphery she has blurry vision.  No double vision.  She reports she is starting to develop a headache.  She does not wear contacts or glasses.  She denies any head injury or trauma.  She reports this is happened once before and resolved spontaneously.  She has had no black or double vision.  She denies eye pain or redness. She denies fevers, neck pain, changes to her balance, double vision, sore throat, chest pain, shortness of breath, abdominal pain, nausea, vomiting or head injury.   The history is provided by the patient and medical records. No language interpreter was used.    Past Medical History:  Diagnosis Date  . Anxiety   . Arthritis   . Cardiac murmur   . Cat allergies   . Depressed   . OCD (obsessive compulsive disorder)   . POTS (postural orthostatic tachycardia syndrome)     Patient Active Problem List   Diagnosis Date Noted  . Lightheadedness 08/10/2011  . Syncope 07/23/2011  . CARDIAC MURMUR 08/18/2009    Past Surgical History:  Procedure Laterality Date  . TONSILLECTOMY      OB History    No data available       Home Medications    Prior to Admission medications   Medication Sig Start Date End Date Taking? Authorizing Provider  escitalopram (LEXAPRO) 20 MG tablet Take  20 mg by mouth daily.   Yes [provider]  lurasidone (LATUDA) 80 MG TABS tablet Take 80 mg by mouth daily with breakfast.   Yes [provider]    Family History Family History  Problem Relation Age of Onset  . Cancer Mother        skin    Social History Social History   Tobacco Use  . Smoking status: Current Every Day Smoker  . Smokeless tobacco: Current User    Types: Chew  . Tobacco comment: quit 10/06  Substance Use Topics  . Alcohol use: No  . Drug use: No     Allergies   Amoxicillin; Azithromycin; Naproxen; and Shellfish allergy   Review of Systems Review of Systems  Constitutional: Negative for chills and fever.  HENT: Negative for congestion and sore throat.   Eyes: Positive for visual disturbance. Negative for photophobia, pain, discharge and redness.  Respiratory: Negative for cough and shortness of breath.   Cardiovascular: Negative for chest pain.  Gastrointestinal: Negative for abdominal pain, diarrhea, nausea and vomiting.  Genitourinary: Negative for dysuria.  Musculoskeletal: Negative for neck pain.  Skin: Negative for rash.  Neurological: Positive for headaches. Negative for dizziness, syncope, weakness, light-headedness and numbness.     Physical Exam Updated Vital Signs BP (!) 140/93 (BP Location: Right Arm)   Pulse 78  Temp 97.7 F (36.5 C) (Oral)   Resp 18   Ht 5\' 7"  (1.702 m)   Wt 90.7 kg (200 lb)   LMP 11/13/2017   SpO2 98%   BMI 31.32 kg/m   Physical Exam  Constitutional: She is oriented to person, place, and time. She appears well-developed and well-nourished. No distress.  Nontoxic appearing.  HENT:  Head: Normocephalic and atraumatic.  Right Ear: External ear normal.  Left Ear: External ear normal.  Mouth/Throat: Oropharynx is clear and moist. No oropharyngeal exudate.  Bilateral tympanic membranes are pearly-gray without erythema or loss of landmarks.   Eyes: Conjunctivae and EOM are normal. Pupils  are equal, round, and reactive to light. Right eye exhibits no discharge. Left eye exhibits no discharge.  EOMs are intact.  No conjunctival injection.  She reports she can see my fingers moving on peripheral vision exam, but they are blurry.  Central vision bilaterally is normal.  Neck: Normal range of motion. Neck supple. No JVD present. No tracheal deviation present.  Cardiovascular: Normal rate, regular rhythm, normal heart sounds and intact distal pulses. Exam reveals no gallop and no friction rub.  No murmur heard. Pulmonary/Chest: Effort normal and breath sounds normal. No respiratory distress. She has no wheezes. She has no rales.  Abdominal: Soft. There is no tenderness.  Musculoskeletal: She exhibits no edema.  Lymphadenopathy:    She has no cervical adenopathy.  Neurological: She is alert and oriented to person, place, and time. No cranial nerve deficit or sensory deficit. She exhibits normal muscle tone. Coordination normal.  Patient is alert and oriented 3. Speech is clear and coherent. Cranial nerves are intact. Sensation and strength is intact to bilateral upper and lower extremities.  Speech is clear and coherent.  No pronator drift.  Finger to nose intact bilaterally.  She reports blurry peripheral vision bilaterally.   EOMs are intact.  Skin: Skin is warm and dry. Capillary refill takes less than 2 seconds. No rash noted. She is not diaphoretic. No erythema. No pallor.  Psychiatric: She has a normal mood and affect. Her behavior is normal.  Nursing note and vitals reviewed.    ED Treatments / Results  Labs (all labs ordered are listed, but only abnormal results are displayed) Labs Reviewed - No data to display  EKG  EKG Interpretation None       Radiology Ct Head Wo Contrast  Result Date: 11/28/2017 CLINICAL DATA:  Headache, visual loss. EXAM: CT HEAD WITHOUT CONTRAST TECHNIQUE: Contiguous axial images were obtained from the base of the skull through the vertex  without intravenous contrast. COMPARISON:  None. FINDINGS: Brain: No evidence of acute infarction, hemorrhage, hydrocephalus, extra-axial collection or mass lesion/mass effect. Vascular: No hyperdense vessel or unexpected calcification. Skull: Normal. Negative for fracture or focal lesion. Sinuses/Orbits: No acute finding. Other: None. IMPRESSION: Normal head CT. Electronically Signed   By: Marijo Conception, M.D.   On: 11/28/2017 15:56    Procedures Procedures (including critical care time)    Visual Acuity  Right Eye Distance: 20/30(Uncorrected) Left Eye Distance: 20/20(Uncorrected) Bilateral Distance: 20/25(Uncorrected)  Right Eye Near:   Left Eye Near:    Bilateral Near:      Medications Ordered in ED Medications  sodium chloride 0.9 % bolus 1,000 mL (1,000 mLs Intravenous New Bag/Given 11/28/17 1600)  metoCLOPramide (REGLAN) injection 10 mg (10 mg Intravenous Given 11/28/17 1601)  diphenhydrAMINE (BENADRYL) injection 25 mg (25 mg Intravenous Given 11/28/17 1601)  dexamethasone (DECADRON) injection 10 mg (10 mg Intravenous  Given 11/28/17 1601)     Initial Impression / Assessment and Plan / ED Course  I have reviewed the triage vital signs and the nursing notes.  Pertinent labs & imaging results that were available during my care of the patient were reviewed by me and considered in my medical decision making (see chart for details).    This is a 41 y.o. Female who presents to the ED complaining of visual changes starting today.  Patient reports she was at the [facility] and after leaving she had blurry vision to both of her eyes.  She reports this lasted for about 3 minutes and then her blurry vision improved to only having blurry peripheral vision.  She reports it seems there is like a halo around her vision and she can see clearly looking forward but on the periphery she has blurry vision.  No double vision.  She reports she is starting to develop a headache.  She does not wear contacts or  glasses.  She denies any head injury or trauma.  She reports this is happened once before and resolved spontaneously.  She has had no black or double vision.  She denies eye pain or redness. On exam the patient is afebrile and not toxic appearing.  She reports blurry peripheral vision bilaterally.  No other visual changes noted.  She otherwise has no focal neurological deficits.  EOMs are intact.  Normal gait.  Will obtain CT head and provide with headache cocktail and reevaluate.  CT head without contrast shows no acute findings. At reevaluation following headache cocktail patient reports her symptoms are improving.  She reports her headache is resolving and she no longer has peripheral blurry vision.  She reports she feels like she has more blurry vision upwards bilaterally.  She reports she is feeling better and that her vision is improving.  We will discharge at this time with [facility] neurology. We discussed this could be an ocular migraine or a pituitary lesion. She agrees with plan for neurology follow up. Return precautions discussed. I advised the patient to follow-up with their primary care provider this week. I advised the patient to return to the emergency department with new or worsening symptoms or new concerns. The patient verbalized understanding and agreement with plan.   This patient was discussed with and evaluated by Dr. Alvino Chapel who agrees with assessment and plan.   Final Clinical Impressions(s) / ED Diagnoses   Final diagnoses:  Blurry vision, bilateral  Bad headache    ED Discharge Orders    None       Sharmaine Base 11/28/17 1633    Davonna Belling, MD 11/29/17 [phone]

## 2018-10-22 ENCOUNTER — Emergency Department (INDEPENDENT_AMBULATORY_CARE_PROVIDER_SITE_OTHER)
Admission: EM | Admit: 2018-10-22 | Discharge: 2018-10-22 | Disposition: A | Discharge status: Home / Self Care | Payer: BLUE CROSS/BLUE SHIELD | Source: Home / Self Care | Attending: Family Medicine | Admitting: Family Medicine

## 2018-10-22 ENCOUNTER — Encounter: Payer: Self-pay | Admitting: *Deleted

## 2018-10-22 DIAGNOSIS — F418 Other specified anxiety disorders: Secondary | ICD-10-CM | POA: Diagnosis not present

## 2018-10-22 DIAGNOSIS — K580 Irritable bowel syndrome with diarrhea: Secondary | ICD-10-CM

## 2018-10-22 MED ORDER — DICYCLOMINE HCL 10 MG PO CAPS: ORAL_CAPSULE | ORAL | 0 refills | Status: DC

## 2018-10-22 MED ORDER — LORAZEPAM 0.5 MG PO TABS: ORAL_TABLET | ORAL | 0 refills | Status: DC

## 2018-10-22 NOTE — ED Triage Notes (Signed)
Pt states current work leader has instructed her to do something that is "unethical and illegal". She has resigned and is now in a legal dispute with company. She has been unable to sleep, has diarrhea, headaches, restlessness, and tremors that she is r/t her work situation.

## 2018-10-22 NOTE — ED Provider Notes (Signed)
[facility]    CSN: [id number] Arrival date & time: 10/22/18  0947     History   Chief Complaint Chief Complaint  Patient presents with  . Stress    HPI Chelsea Wells is a 42 y.o. female.   Patient has been experiencing increased stress in her job during the past four days as the result of her employer requesting her to perform "unethical and illegal" acts.  She resigned two days ago and has hired an Forensic psychologist for guidance.  She remains exceedingly stressed because her company is demanding that she work an additional two weeks. During the past two days she has had poor sleep, decreased concentration, recurrent headache, nausea, diarrhea, and occasional vomiting.  She notes that her hands were "shaking" yesterday.  She states that her headache is partly improved with ibuprofen 200mg . She has a history of anxiety controlled with Lexapro and Latuda, and she has had occasional IBS symptoms in the past when stressed.  She does not feel depressed.  She reports that she has an initial appointment with Dr. Aundria Mems on 11/04/18.  The history is provided by the patient.  Anxiety  This is a recurrent problem. The current episode started more than 2 days ago. The problem occurs constantly. The problem has been gradually worsening. Associated symptoms include headaches. Pertinent negatives include no chest pain and no abdominal pain. The symptoms are aggravated by stress. Nothing relieves the symptoms. Treatments tried: Ibprofen. The treatment provided mild relief.    Past Medical History:  Diagnosis Date  . Anxiety   . Arthritis   . Cardiac murmur   . Cat allergies   . Depressed   . OCD (obsessive compulsive disorder)   . POTS (postural orthostatic tachycardia syndrome)     Patient Active Problem List   Diagnosis Date Noted  . Lightheadedness 08/10/2011  . Syncope 07/23/2011  . CARDIAC MURMUR 08/18/2009    Past Surgical History:  Procedure Laterality Date    . TONSILLECTOMY      OB History   None      Home Medications    Prior to Admission medications   Medication Sig Start Date End Date Taking? Authorizing Provider  escitalopram (LEXAPRO) 20 MG tablet Take 20 mg by mouth daily.   Yes [provider]  lurasidone (LATUDA) 80 MG TABS tablet Take 80 mg by mouth daily with breakfast.   Yes [provider]  dicyclomine (BENTYL) 10 MG capsule Take one tab PO, 2 or 3 times daily as needed for nausea, cramps, and diarrhea 10/22/18   Kandra Nicolas, MD  LORazepam (ATIVAN) 0.5 MG tablet Take one tab PO once or twice daily as needed for stress and anxiety 10/22/18   Kandra Nicolas, MD    Family History Family History  Problem Relation Age of Onset  . Cancer Mother        skin    Social History Social History   Tobacco Use  . Smoking status: Current Every Day Smoker  . Smokeless tobacco: Current User    Types: Chew  . Tobacco comment: quit 10/06  Substance Use Topics  . Alcohol use: No  . Drug use: No     Allergies   Amoxicillin; Azithromycin; Naproxen; and Shellfish allergy   Review of Systems Review of Systems  Constitutional: Positive for appetite change and fatigue. Negative for activity change, chills, diaphoresis and fever.  HENT: Negative.   Eyes: Negative.   Respiratory: Negative.   Cardiovascular: Negative.  Negative  for chest pain.  Gastrointestinal: Positive for diarrhea, nausea and vomiting. Negative for abdominal pain.  Genitourinary: Negative.   Musculoskeletal: Negative.   Neurological: Positive for headaches. Negative for dizziness, facial asymmetry, speech difficulty, light-headedness and numbness.  Psychiatric/Behavioral: Positive for decreased concentration and sleep disturbance. Negative for agitation, confusion, dysphoric mood, self-injury and suicidal ideas. The patient is nervous/anxious. The patient is not hyperactive.      Physical Exam Triage Vital Signs ED Triage Vitals   Enc Vitals Group     BP 10/22/18 1013 119/78     Pulse Rate 10/22/18 1013 80     Resp 10/22/18 1013 16     Temp 10/22/18 1013 98.4 F (36.9 C)     Temp Source 10/22/18 1013 Oral     SpO2 10/22/18 1013 98 %     Weight 10/22/18 1014 196 lb (88.9 kg)     Height 10/22/18 1014 5\' 6"  (1.676 m)     Head Circumference --      Peak Flow --      Pain Score 10/22/18 1013 0     Pain Loc --      Pain Edu? --      Excl. in [place]? --    No data found.  Updated Vital Signs BP 119/78 (BP Location: Right Arm)   Pulse 80   Temp 98.4 F (36.9 C) (Oral)   Resp 16   Ht 5\' 6"  (1.676 m)   Wt 88.9 kg   SpO2 98%   BMI 31.64 kg/m   Visual Acuity Right Eye Distance:   Left Eye Distance:   Bilateral Distance:    Right Eye Near:   Left Eye Near:    Bilateral Near:     Physical Exam Nursing notes and Vital Signs reviewed. Appearance:  Patient appears stated age, and in no acute distress.   Psychiatric:  Patient is alert and oriented with good eye contact.  Thoughts are organized.  No psychomotor retardation.  Memory intact.  Not suicidal.  Mildly depressed mood, normal affect.  Eyes:  Pupils are equal, round, and reactive to light and accomodation.  Extraocular movement is intact.  Conjunctivae are not inflamed   Pharynx:  Normal; moist mucous membranes  Neck:  Supple.  No adenopathy or adenopathy. Lungs:  Clear to auscultation.  Breath sounds are equal.  Moving air well. Heart:  Regular rate and rhythm without murmurs, rubs, or gallops.  Abdomen:  Nontender without masses or hepatosplenomegaly.  Extremities:  No edema.      UC Treatments / Results  Labs (all labs ordered are listed, but only abnormal results are displayed) Labs Reviewed - No data to display  EKG None  Radiology No results found.  Procedures Procedures (including critical care time)  Medications Ordered in UC Medications - No data to display  Initial Impression / Assessment and Plan / UC Course  I have reviewed  the triage vital signs and the nursing notes.  Pertinent labs & imaging results that were available during my care of the patient were reviewed by me and considered in my medical decision making (see chart for details).    Continue Lexapro and Latuda as prescribed. For headaches, may increase Ibuprofen 200mg  to 4 tabs every 8 hours with food.  Begin a short term trial of low dose lorazepam and Bentyl (#20 of each with no refill) Controlled Substance Prescriptions I have consulted the [place] Controlled Substances Registry for this patient, and feel the risk/benefit ratio today is favorable for  proceeding with this prescription for a controlled substance.  Followup with Dr. Aundria Mems on 11/04/18 as scheduled.  Final Clinical Impressions(s) / UC Diagnoses   Final diagnoses:  Situational anxiety  Irritable bowel syndrome with diarrhea   Discharge Instructions   None    ED Prescriptions    Medication Sig Dispense Auth. Provider   LORazepam (ATIVAN) 0.5 MG tablet Take one tab PO once or twice daily as needed for stress and anxiety 20 tablet Kandra Nicolas, MD   dicyclomine (BENTYL) 10 MG capsule Take one tab PO, 2 or 3 times daily as needed for nausea, cramps, and diarrhea 20 capsule Kandra Nicolas, MD         Kandra Nicolas, MD 10/22/18 1122

## 2018-10-29 ENCOUNTER — Emergency Department (HOSPITAL_BASED_OUTPATIENT_CLINIC_OR_DEPARTMENT_OTHER)
Admission: EM | Admit: 2018-10-29 | Discharge: 2018-10-29 | Disposition: A | Discharge status: Home / Self Care | Payer: BLUE CROSS/BLUE SHIELD | Attending: Emergency Medicine | Admitting: Emergency Medicine

## 2018-10-29 ENCOUNTER — Encounter (HOSPITAL_BASED_OUTPATIENT_CLINIC_OR_DEPARTMENT_OTHER): Payer: Self-pay | Admitting: *Deleted

## 2018-10-29 ENCOUNTER — Other Ambulatory Visit: Payer: Self-pay

## 2018-10-29 DIAGNOSIS — F17210 Nicotine dependence, cigarettes, uncomplicated: Secondary | ICD-10-CM | POA: Insufficient documentation

## 2018-10-29 DIAGNOSIS — Z23 Encounter for immunization: Secondary | ICD-10-CM | POA: Diagnosis not present

## 2018-10-29 DIAGNOSIS — Z2914 Encounter for prophylactic rabies immune globin: Secondary | ICD-10-CM | POA: Insufficient documentation

## 2018-10-29 DIAGNOSIS — S61431A Puncture wound without foreign body of right hand, initial encounter: Secondary | ICD-10-CM | POA: Diagnosis not present

## 2018-10-29 DIAGNOSIS — Y939 Activity, unspecified: Secondary | ICD-10-CM | POA: Insufficient documentation

## 2018-10-29 DIAGNOSIS — Y929 Unspecified place or not applicable: Secondary | ICD-10-CM | POA: Insufficient documentation

## 2018-10-29 DIAGNOSIS — W540XXA Bitten by dog, initial encounter: Secondary | ICD-10-CM | POA: Diagnosis not present

## 2018-10-29 DIAGNOSIS — Z79899 Other long term (current) drug therapy: Secondary | ICD-10-CM | POA: Insufficient documentation

## 2018-10-29 DIAGNOSIS — Z1231 Encounter for screening mammogram for malignant neoplasm of breast: Secondary | ICD-10-CM

## 2018-10-29 DIAGNOSIS — Y999 Unspecified external cause status: Secondary | ICD-10-CM | POA: Insufficient documentation

## 2018-10-29 LAB — PREGNANCY, URINE: PREG TEST UR: NEGATIVE

## 2018-10-29 MED ORDER — CLINDAMYCIN HCL 150 MG PO CAPS: 300.0000 mg | ORAL_CAPSULE | Freq: Three times a day (TID) | ORAL | 0 refills | Status: DC

## 2018-10-29 MED ORDER — FLUCONAZOLE 150 MG PO TABS: 150.0000 mg | ORAL_TABLET | Freq: Once | ORAL | 0 refills | Status: AC

## 2018-10-29 MED ORDER — TETANUS-DIPHTH-ACELL PERTUSSIS 5-2.5-18.5 LF-MCG/0.5 IM SUSP
0.5000 mL | Freq: Once | INTRAMUSCULAR | Status: AC
  Administered 2018-10-29: 0.5 mL via INTRAMUSCULAR
  Filled 2018-10-29: qty 0.5

## 2018-10-29 MED ORDER — RABIES IMMUNE GLOBULIN 150 UNIT/ML IM INJ
20.0000 [IU]/kg | INJECTION | Freq: Once | INTRAMUSCULAR | Status: AC
  Administered 2018-10-29: 1800 [IU] via INTRAMUSCULAR
  Filled 2018-10-29: qty 12

## 2018-10-29 MED ORDER — CLINDAMYCIN HCL 150 MG PO CAPS
300.0000 mg | ORAL_CAPSULE | Freq: Once | ORAL | Status: AC
  Administered 2018-10-29: 300 mg via ORAL
  Filled 2018-10-29: qty 2

## 2018-10-29 MED ORDER — DOXYCYCLINE HYCLATE 100 MG PO CAPS: 100.0000 mg | ORAL_CAPSULE | Freq: Two times a day (BID) | ORAL | 0 refills | Status: DC

## 2018-10-29 MED ORDER — RABIES VACCINE, PCEC IM SUSR
1.0000 mL | Freq: Once | INTRAMUSCULAR | Status: AC
  Administered 2018-10-29: 1 mL via INTRAMUSCULAR
  Filled 2018-10-29: qty 1

## 2018-10-29 MED ORDER — DOXYCYCLINE HYCLATE 100 MG PO TABS
100.0000 mg | ORAL_TABLET | Freq: Once | ORAL | Status: AC
  Administered 2018-10-29: 100 mg via ORAL
  Filled 2018-10-29: qty 1

## 2018-10-29 NOTE — ED Triage Notes (Signed)
pt c/o dog bite to right hand x 3 days ago , stray dog , puncture wound x 2

## 2018-10-29 NOTE — ED Provider Notes (Signed)
[facility] EMERGENCY DEPARTMENT Provider Note   CSN: [id number] Arrival date & time: 10/29/18  1540     History   Chief Complaint Chief Complaint  Patient presents with  . Animal Bite    HPI Chelsea Wells is a 42 y.o. female.  42 yo F with a chief complaint of a dog bite.  This happened about 3 days ago.  She said it better and then ran away.  She did not think much of it until it started having some increased pain and swelling.  She went to urgent care and they sent her here for evaluation.  She denies fevers or chills denies other area of injury.  The history is provided by the patient.  Animal Bite  Contact animal:  Dog Location:  Hand Hand injury location:  [place] of R hand Time since incident:  3 days Pain details:    Quality:  Aching and burning   Severity:  Mild   Timing:  Constant   Progression:  Worsening Incident location:  Outside Provoked: unprovoked   Notifications:  None Animal's rabies vaccination status:  Never received Animal in possession: no   Tetanus status:  Unknown Relieved by:  Nothing Worsened by:  Nothing Ineffective treatments:  None tried Associated symptoms: no fever     Past Medical History:  Diagnosis Date  . Anxiety   . Arthritis   . Cardiac murmur   . Cat allergies   . Depressed   . OCD (obsessive compulsive disorder)   . POTS (postural orthostatic tachycardia syndrome)     Patient Active Problem List   Diagnosis Date Noted  . Lightheadedness 08/10/2011  . Syncope 07/23/2011  . CARDIAC MURMUR 08/18/2009    Past Surgical History:  Procedure Laterality Date  . TONSILLECTOMY       OB History   None      Home Medications    Prior to Admission medications   Medication Sig Start Date End Date Taking? Authorizing Provider  clindamycin (CLEOCIN) 150 MG capsule Take 2 capsules (300 mg total) by mouth 3 (three) times daily for 10 days. 10/29/18 11/08/18  Deno Etienne, DO  dicyclomine (BENTYL) 10 MG  capsule Take one tab PO, 2 or 3 times daily as needed for nausea, cramps, and diarrhea 10/22/18   Kandra Nicolas, MD  doxycycline (VIBRAMYCIN) 100 MG capsule Take 1 capsule (100 mg total) by mouth 2 (two) times daily for 10 days. One po bid x 7 days 10/29/18 11/08/18  Deno Etienne, DO  escitalopram (LEXAPRO) 20 MG tablet Take 20 mg by mouth daily.    [provider]  fluconazole (DIFLUCAN) 150 MG tablet Take 1 tablet (150 mg total) by mouth once for 1 dose. 10/29/18 10/29/18  Deno Etienne, DO  lurasidone (LATUDA) 80 MG TABS tablet Take 80 mg by mouth daily with breakfast.    [provider]    Family History Family History  Problem Relation Age of Onset  . Cancer Mother        skin    Social History Social History   Tobacco Use  . Smoking status: Current Every Day Smoker    Packs/day: 0.50  . Smokeless tobacco: Current User    Types: Chew  . Tobacco comment: quit 10/06  Substance Use Topics  . Alcohol use: No  . Drug use: No     Allergies   Amoxicillin; Azithromycin; Naproxen; and Shellfish allergy   Review of Systems Review of Systems  Constitutional: Negative for chills  and fever.  HENT: Negative for congestion and rhinorrhea.   Eyes: Negative for redness and visual disturbance.  Respiratory: Negative for shortness of breath and wheezing.   Cardiovascular: Negative for chest pain and palpitations.  Gastrointestinal: Negative for nausea and vomiting.  Genitourinary: Negative for dysuria and urgency.  Musculoskeletal: Negative for arthralgias and myalgias.  Skin: Positive for color change and wound. Negative for pallor.  Neurological: Negative for dizziness and headaches.     Physical Exam Updated Vital Signs BP 106/72   Pulse 76   Temp 98.2 F (36.8 C)   Resp 16   Ht 5\' 6"  (1.676 m)   Wt 88.9 kg   LMP 10/03/2018   SpO2 100%   BMI 31.64 kg/m   Physical Exam  Constitutional: She is oriented to person, place, and time. She appears  well-developed and well-nourished. No distress.  HENT:  Head: Normocephalic and atraumatic.  Eyes: Pupils are equal, round, and reactive to light. EOM are normal.  Neck: Normal range of motion. Neck supple.  Cardiovascular: Normal rate and regular rhythm. Exam reveals no gallop and no friction rub.  No murmur heard. Pulmonary/Chest: Effort normal. She has no wheezes. She has no rales.  Abdominal: Soft. She exhibits no distension. There is no tenderness.  Musculoskeletal: She exhibits edema. She exhibits no tenderness.  2 small puncture wounds to the dorsum of the right hand.  Mild erythema and edema.  No extending redness beyond the wrist.  Mildly warm to the touch.  No area of fluctuance or induration.  Neurological: She is alert and oriented to person, place, and time.  Skin: Skin is warm and dry. She is not diaphoretic.  Psychiatric: She has a normal mood and affect. Her behavior is normal.  Nursing note and vitals reviewed.    ED Treatments / Results  Labs (all labs ordered are listed, but only abnormal results are displayed) Labs Reviewed  PREGNANCY, URINE    EKG None  Radiology No results found.  Procedures Procedures (including critical care time)  Medications Ordered in ED Medications  rabies immune globulin (HYPERAB/KEDRAB) injection 1,800 Units (1,800 Units Intramuscular Given 10/29/18 1616)  rabies vaccine (RABAVERT) injection 1 mL (1 mL Intramuscular Given 10/29/18 1614)  Tdap (BOOSTRIX) injection 0.5 mL (0.5 mLs Intramuscular Given 10/29/18 1612)  doxycycline (VIBRA-TABS) tablet 100 mg (100 mg Oral Given 10/29/18 1624)  clindamycin (CLEOCIN) capsule 300 mg (300 mg Oral Given 10/29/18 1624)     Initial Impression / Assessment and Plan / ED Course  I have reviewed the triage vital signs and the nursing notes.  Pertinent labs & imaging results that were available during my care of the patient were reviewed by me and considered in my medical decision making  (see chart for details).     42 yo F with a chief complaint of being bit by dog.  Unfortunately this was a couple days ago and she did not reported to animal control.  At this point I feel the most conservative route would be to start her on rabies vaccination.  Up to date recommends dual therapy for cellulitis treatment as she is allergic to amox.   4:40 PM:  I have discussed the diagnosis/risks/treatment options with the patient and believe the pt to be eligible for discharge home to follow-up with PCP. We also discussed returning to the ED immediately if new or worsening sx occur. We discussed the sx which are most concerning (e.g., sudden worsening pain, fever, inability to tolerate by mouth) that necessitate  immediate return. Medications administered to the patient during their visit and any new prescriptions provided to the patient are listed below.  Medications given during this visit Medications  rabies immune globulin (HYPERAB/KEDRAB) injection 1,800 Units (1,800 Units Intramuscular Given 10/29/18 1616)  rabies vaccine (RABAVERT) injection 1 mL (1 mL Intramuscular Given 10/29/18 1614)  Tdap (BOOSTRIX) injection 0.5 mL (0.5 mLs Intramuscular Given 10/29/18 1612)  doxycycline (VIBRA-TABS) tablet 100 mg (100 mg Oral Given 10/29/18 1624)  clindamycin (CLEOCIN) capsule 300 mg (300 mg Oral Given 10/29/18 1624)      The patient appears reasonably screen and/or stabilized for discharge and I doubt any other medical condition or other [facility] requiring further screening, evaluation, or treatment in the ED at this time prior to discharge.   Final Clinical Impressions(s) / ED Diagnoses   Final diagnoses:  Dog bite, initial encounter    ED Discharge Orders         Ordered    clindamycin (CLEOCIN) 150 MG capsule  3 times daily     10/29/18 1606    doxycycline (VIBRAMYCIN) 100 MG capsule  2 times daily     10/29/18 1606    fluconazole (DIFLUCAN) 150 MG tablet   Once     10/29/18 [place], Montrail Mehrer, DO 10/29/18 1640

## 2018-10-29 NOTE — ED Notes (Signed)
ED Provider at bedside. 

## 2018-10-29 NOTE — Discharge Instructions (Signed)
Follow up with the urgent care center for the next doses of your rabies vaccine.  Return to the ED for rapid spreading redness, fever.

## 2018-10-31 ENCOUNTER — Encounter: Payer: Self-pay | Admitting: Emergency Medicine

## 2018-10-31 ENCOUNTER — Emergency Department (INDEPENDENT_AMBULATORY_CARE_PROVIDER_SITE_OTHER)
Admission: EM | Admit: 2018-10-31 | Discharge: 2018-10-31 | Disposition: A | Discharge status: Home / Self Care | Payer: BLUE CROSS/BLUE SHIELD | Source: Home / Self Care

## 2018-10-31 DIAGNOSIS — Z203 Contact with and (suspected) exposure to rabies: Secondary | ICD-10-CM | POA: Diagnosis not present

## 2018-10-31 DIAGNOSIS — Z23 Encounter for immunization: Secondary | ICD-10-CM | POA: Diagnosis not present

## 2018-10-31 DIAGNOSIS — T148XXA Other injury of unspecified body region, initial encounter: Secondary | ICD-10-CM

## 2018-10-31 MED ORDER — RABIES VACCINE, PCEC IM SUSR
1.0000 mL | Freq: Once | INTRAMUSCULAR | Status: AC
  Administered 2018-10-31: 1 mL via INTRAMUSCULAR

## 2018-10-31 NOTE — ED Triage Notes (Signed)
Rabies shot, Rt Deltoid

## 2018-11-04 ENCOUNTER — Ambulatory Visit (HOSPITAL_BASED_OUTPATIENT_CLINIC_OR_DEPARTMENT_OTHER): Payer: BLUE CROSS/BLUE SHIELD

## 2018-11-04 ENCOUNTER — Encounter: Payer: Self-pay | Admitting: Physician Assistant

## 2018-11-04 ENCOUNTER — Ambulatory Visit (INDEPENDENT_AMBULATORY_CARE_PROVIDER_SITE_OTHER): Payer: BLUE CROSS/BLUE SHIELD | Admitting: Physician Assistant

## 2018-11-04 ENCOUNTER — Other Ambulatory Visit: Payer: Self-pay | Admitting: Physician Assistant

## 2018-11-04 VITALS — BP 107/73 | HR 74 | Ht 66.0 in | Wt 194.0 lb

## 2018-11-04 DIAGNOSIS — F1021 Alcohol dependence, in remission: Secondary | ICD-10-CM | POA: Insufficient documentation

## 2018-11-04 DIAGNOSIS — Z7689 Persons encountering health services in other specified circumstances: Secondary | ICD-10-CM

## 2018-11-04 DIAGNOSIS — Z91013 Allergy to seafood: Secondary | ICD-10-CM

## 2018-11-04 DIAGNOSIS — F17220 Nicotine dependence, chewing tobacco, uncomplicated: Secondary | ICD-10-CM

## 2018-11-04 DIAGNOSIS — F3175 Bipolar disorder, in partial remission, most recent episode depressed: Secondary | ICD-10-CM

## 2018-11-04 DIAGNOSIS — Z1211 Encounter for screening for malignant neoplasm of colon: Secondary | ICD-10-CM

## 2018-11-04 DIAGNOSIS — Z800 Family history of malignant neoplasm of digestive organs: Secondary | ICD-10-CM

## 2018-11-04 DIAGNOSIS — Z13220 Encounter for screening for lipoid disorders: Secondary | ICD-10-CM

## 2018-11-04 DIAGNOSIS — Z131 Encounter for screening for diabetes mellitus: Secondary | ICD-10-CM

## 2018-11-04 DIAGNOSIS — Z1231 Encounter for screening mammogram for malignant neoplasm of breast: Secondary | ICD-10-CM

## 2018-11-04 DIAGNOSIS — Z130 Encounter for screening for diseases of the blood and blood-forming organs and certain disorders involving the immune mechanism: Secondary | ICD-10-CM

## 2018-11-04 HISTORY — DX: Bipolar disorder, in partial remission, most recent episode depressed: F31.75

## 2018-11-04 HISTORY — DX: Family history of malignant neoplasm of digestive organs: Z80.0

## 2018-11-04 HISTORY — DX: Nicotine dependence, chewing tobacco, uncomplicated: F17.220

## 2018-11-04 HISTORY — DX: Allergy to seafood: Z91.013

## 2018-11-04 HISTORY — DX: Alcohol dependence, in remission: F10.21

## 2018-11-04 NOTE — Patient Instructions (Addendum)
I have also ordered fasting labs. The lab is a walk-in open M-F 7:30a-4:30p (closed 12:30-1:30p). Nothing to eat or drink after midnight or at least 8 hours before your blood draw. You can have water and your medications.     

## 2018-11-04 NOTE — Progress Notes (Signed)
HPI:                                                                Chelsea Wells is a 43 y.o. female who presents to [facility]: [facility] today to establish care  Current concerns: stress/anxiety  Currently taking Latuda 80 mg and Lexapro 20 mg for bipolar depression and reports she can have obsessive behaviors when she is stressed out. She states she has not had a bipolar episode in several years. She has been stable on her medications for the last 2 years. Reports her psychiatrist has moved her practice to [place] and she would like me to take over her prescriptions. 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 She has alcohol use disorder and has been sober for 1.5 years. She attends AA daily. She states she is very well supported by her sponsor and grand-sponsor. She is followed by a counselor in [place]. Because of her history of alcohol abuse, she would like to avoid all controlled substances, including benzodiazepines.  Currrently chews 1 can per day and smokes 1/2 ppd of cigarettes. She has tried patches and gum in the past.  Also reports mother was diagnosed with colon cancer in her early 34's.  Depression screen PHQ 2/9 11/04/2018  Decreased Interest 0  Down, Depressed, Hopeless 0  PHQ - 2 Score 0    GAD 7 : Generalized Anxiety Score 11/04/2018  Nervous, Anxious, on Edge 1  Control/stop worrying 1  Worry too much - different things 1  Trouble relaxing 1  Restless 1  Easily annoyed or irritable 1  Afraid - awful might happen 0  Total GAD 7 Score 6  Anxiety Difficulty Somewhat difficult      Past Medical History:  Diagnosis Date  . Anxiety   . Arthritis   . Cardiac murmur   . Cat allergies   . Depressed   . OCD (obsessive compulsive disorder)   . POTS (postural orthostatic tachycardia syndrome)    Past Surgical History:  Procedure Laterality Date  . TONSILLECTOMY     Social History    Tobacco Use  . Smoking status: Current Every Day Smoker    Packs/day: 0.50    Years: 3.00    Pack years: 1.50  . Smokeless tobacco: Current User    Types: Chew  . Tobacco comment: quit 10/06  Substance Use Topics  . Alcohol use: Not Currently    Comment: sober since 2018   family history includes Breast cancer in her paternal grandmother; Cancer in her mother; Colon cancer in her mother.    ROS: Review of Systems  Gastrointestinal: Positive for diarrhea and vomiting.  Psychiatric/Behavioral: Positive for depression. Negative for hallucinations and suicidal ideas. The patient is nervous/anxious.      Medications: Current Outpatient Medications  Medication Sig Dispense Refill  . dicyclomine (BENTYL) 10 MG capsule Take 10 mg by mouth 4 (four) times daily -  before meals and at bedtime.    Marland Kitchen escitalopram (LEXAPRO) 20 MG tablet Take 20 mg by mouth daily.    Marland Kitchen lurasidone (LATUDA) 80 MG TABS tablet Take 80 mg by mouth daily with breakfast.     No current facility-administered medications for this visit.  Allergies  Allergen Reactions  . Shellfish Allergy Anaphylaxis  . Amoxicillin Rash    Blisters in mouth  . Azithromycin Rash  . Naproxen Rash    Blister in mouth       Objective:  BP 107/73   Pulse 74   Ht 5\' 6"  (1.676 m)   Wt 194 lb (88 kg)   LMP 11/02/2018   BMI 31.31 kg/m  Gen:  alert, not ill-appearing, no distress, appropriate for age 64: head normocephalic without obvious abnormality, conjunctiva and cornea clear, trachea midline Pulm: Normal work of breathing, normal phonation Neuro: alert and oriented x 3, no tremor MSK: extremities atraumatic, normal gait and station Skin: intact, no rashes on exposed skin, no jaundice, no cyanosis Psych: well-groomed, cooperative, good eye contact, euthymic mood, affect mood-congruent, speech is articulate, and thought processes clear and goal-directed    No results found for this or any previous visit (from  the past 72 hour(s)). No results found.    Assessment and Plan: 41 y.o. female with   .Chelsea Wells was seen today for establish care.  Diagnoses and all orders for this visit:  Encounter to establish care  Family history of colon cancer in mother -     Ambulatory referral to Gastroenterology  Bipolar 1 disorder, depressed, partial remission ([place])  Moderate alcohol use disorder, in sustained remission ([place])  Colon cancer screening -     Ambulatory referral to Gastroenterology  Screening for lipid disorders -     Lipid Panel w/reflex Direct LDL  Screening for diabetes mellitus -     COMPLETE METABOLIC PANEL WITH GFR  Screening for blood disease -     CBC -     COMPLETE METABOLIC PANEL WITH GFR   - Personally reviewed PMH, PSH, PFH, medications, allergies, HM - Age-appropriate cancer screening: mammogram is scheduled, referral placed to GI for colonoscopy, return for pap smear - Influenza declined - Tdap UTD - PHQ2 negative  Bipolar 1 disorder - in remission. Agree to take over her current medications. We discussed that if she develops mania or depression, would refer her to Psychiatry for medication adjustments. Encouraged to continue regular AA meetings and counseling  Encouraged to schedule annual physical exam with pap. Fasting labs ordered  Patient education and anticipatory guidance given Patient agrees with treatment plan Follow-up every 6 months for medication management or sooner as needed if symptoms worsen or fail to improve  Darlyne Russian PA-C

## 2018-11-11 ENCOUNTER — Telehealth: Payer: Self-pay | Admitting: Physician Assistant

## 2018-11-11 NOTE — Telephone Encounter (Signed)
Patient requested Dr. Collene Mares, so just make her aware of the reason we are changing her referral I am okay with you referring to [facility]

## 2018-11-11 NOTE — Telephone Encounter (Signed)
I received a voicemail from [place] at Dr. Lorie Apley office stating after Dr. Collene Mares reviewed the records she is not able to see patient. I can send to [facility] or [facility]. Please advise - CF

## 2018-11-11 NOTE — Telephone Encounter (Signed)
Sent referral to [facility] and called patient to let her know - CF

## 2018-11-12 ENCOUNTER — Ambulatory Visit: Payer: BLUE CROSS/BLUE SHIELD | Admitting: Physician Assistant

## 2018-11-26 ENCOUNTER — Encounter: Payer: BLUE CROSS/BLUE SHIELD | Admitting: Physician Assistant

## 2019-01-07 ENCOUNTER — Encounter: Payer: Self-pay | Admitting: Physician Assistant

## 2019-04-07 ENCOUNTER — Encounter: Payer: Self-pay | Admitting: Physician Assistant

## 2019-04-07 ENCOUNTER — Ambulatory Visit (INDEPENDENT_AMBULATORY_CARE_PROVIDER_SITE_OTHER): Payer: BLUE CROSS/BLUE SHIELD | Admitting: Physician Assistant

## 2019-04-07 DIAGNOSIS — F3175 Bipolar disorder, in partial remission, most recent episode depressed: Secondary | ICD-10-CM | POA: Diagnosis not present

## 2019-04-07 DIAGNOSIS — F1021 Alcohol dependence, in remission: Secondary | ICD-10-CM

## 2019-04-07 DIAGNOSIS — F17220 Nicotine dependence, chewing tobacco, uncomplicated: Secondary | ICD-10-CM

## 2019-04-07 MED ORDER — ESCITALOPRAM OXALATE 20 MG PO TABS: 20.0000 mg | ORAL_TABLET | Freq: Every day | ORAL | 1 refills | Status: DC

## 2019-04-07 MED ORDER — LURASIDONE HCL 80 MG PO TABS: 80.0000 mg | ORAL_TABLET | Freq: Every day | ORAL | 1 refills | Status: DC

## 2019-04-07 NOTE — Progress Notes (Signed)
Virtual Visit via Video Note  I connected with Chelsea Wells on 04/07/19 at  3:40 PM EDT by a video enabled telemedicine application and verified that I am speaking with the correct person using two identifiers.   I discussed the limitations of evaluation and management by telemedicine and the availability of in person appointments. The patient expressed understanding and agreed to proceed.  History of Present Illness: HPI:                                                                Chelsea Wells is a 43 y.o. female with PMH of bipolar disorder, alcohol use disorder in remission, tobacco use  CC: medication refills  She has been taking [place] and Lexapro for approx 4 years. Denies any issues as far as mood, sleep, anxiety. Ran out of her medications about 4 days ago.   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 Travels for work. No longer in therapy - therapist is more for co-parenting and they have not had any disagreements or need in the last year Still attending AA online   Substance Nicotine - Currrently chews 1 can/day and smokes 1/2 ppd cigarettes Alcohol - sober nearly 2 years Drugs - none  Depression screen [facility] 2/9 04/07/2019 11/04/2018  Decreased Interest 0 0  Down, Depressed, Hopeless 0 0  PHQ - 2 Score 0 0  Altered sleeping 0 -  Tired, decreased energy 0 -  Change in appetite 0 -  Feeling bad or failure about yourself  0 -  Trouble concentrating 0 -  Moving slowly or fidgety/restless 0 -  Suicidal thoughts 0 -  PHQ-9 Score 0 -    GAD 7 : Generalized Anxiety Score 04/07/2019 11/04/2018  Nervous, Anxious, on Edge 0 1  Control/stop worrying 0 1  Worry too much - different things 0 1  Trouble relaxing 0 1  Restless 0 1  Easily annoyed or irritable 0 1  Afraid - awful might happen 0 0  Total GAD 7 Score 0 6  Anxiety Difficulty - Somewhat difficult      Past Medical History:  Diagnosis Date   . Anxiety   . Arthritis   . Cardiac murmur   . Cat allergies   . Depressed   . OCD (obsessive compulsive disorder)   . POTS (postural orthostatic tachycardia syndrome)    Past Surgical History:  Procedure Laterality Date  . TONSILLECTOMY     Social History   Tobacco Use  . Smoking status: Current Every Day Smoker    Packs/day: 0.50    Years: 3.00    Pack years: 1.50  . Smokeless tobacco: Current User    Types: Chew  . Tobacco comment: quit 10/06  Substance Use Topics  . Alcohol use: Not Currently    Comment: sober since 2018   family history includes Breast cancer in her paternal grandmother; Cancer in her mother; Colon cancer in her mother.    ROS: negative except as noted in the HPI  Medications: Current Outpatient Medications  Medication Sig Dispense Refill  . escitalopram (LEXAPRO) 20 MG tablet Take 1 tablet (20 mg total) by mouth daily. 90 tablet 1  . lurasidone (LATUDA) 80 MG TABS tablet  Take 1 tablet (80 mg total) by mouth daily with breakfast. 90 tablet 1  . dicyclomine (BENTYL) 10 MG capsule Take 10 mg by mouth 4 (four) times daily -  before meals and at bedtime.     No current facility-administered medications for this visit.    Allergies  Allergen Reactions  . Shellfish Allergy Anaphylaxis  . Amoxicillin Rash    Blisters in mouth  . Azithromycin Rash  . Naproxen Rash    Blister in mouth       Objective:  There were no vitals taken for this visit. Gen:  alert, not ill-appearing, no distress, appropriate for age 38: head normocephalic without obvious abnormality, conjunctiva and cornea clear, trachea midline Pulm: Normal work of breathing, normal phonation Neuro: alert and oriented x 3 Psych: cooperative, euthymic mood, affect mood-congruent, speech is articulate, normal rate and volume; thought processes clear and goal-directed, normal judgment, good insight     Assessment and Plan: 43 y.o. female with   .Ibeth was seen today for  medication management.  Diagnoses and all orders for this visit:  Bipolar 1 disorder, depressed, partial remission ([place]) -     escitalopram (LEXAPRO) 20 MG tablet; Take 1 tablet (20 mg total) by mouth daily. -     lurasidone (LATUDA) 80 MG TABS tablet; Take 1 tablet (80 mg total) by mouth daily with breakfast.  Moderate alcohol use disorder, in sustained remission ([facility])  Chewing tobacco nicotine dependence without complication  IAX6=5 VVZ4=8 Refills provided for 6 months   Follow Up Instructions:    I discussed the assessment and treatment plan with the patient. The patient was provided an opportunity to ask questions and all were answered. The patient agreed with the plan and demonstrated an understanding of the instructions.   The patient was advised to call back or seek an in-person evaluation if the symptoms worsen or if the condition fails to improve as anticipated.  I provided 10 minutes of non-face-to-face time during this encounter.   Trixie Dredge, [place]

## 2019-05-09 ENCOUNTER — Encounter: Payer: Self-pay | Admitting: Physician Assistant

## 2019-06-06 ENCOUNTER — Encounter: Payer: Self-pay | Admitting: Physician Assistant

## 2019-08-15 ENCOUNTER — Other Ambulatory Visit: Payer: Self-pay

## 2019-08-15 DIAGNOSIS — Z20822 Contact with and (suspected) exposure to covid-19: Secondary | ICD-10-CM

## 2019-08-17 LAB — NOVEL CORONAVIRUS, NAA: SARS-CoV-2, NAA: NOT DETECTED

## 2019-08-27 IMAGING — CT CT HEAD W/O CM
3 series · 14 of 46 positions shown, 16 images · non-contrast
Comparison: None.

CLINICAL DATA: Headache, visual loss.

EXAM:
CT HEAD WITHOUT CONTRAST
TECHNIQUE: Contiguous axial images were obtained from the base of the skull
through the vertex without intravenous contrast.

[Series 2: head wo · axial · 0.45mm/px · z∈[+937,+1057]mm · 8 of 29 slices shown, 10 images]
[im 3/29  brain]
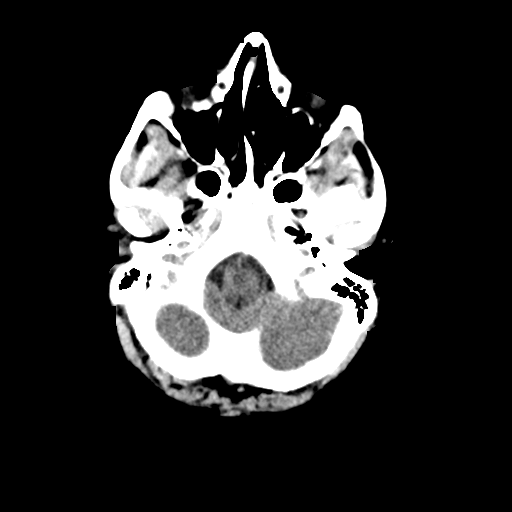
[im 3/29  bone]
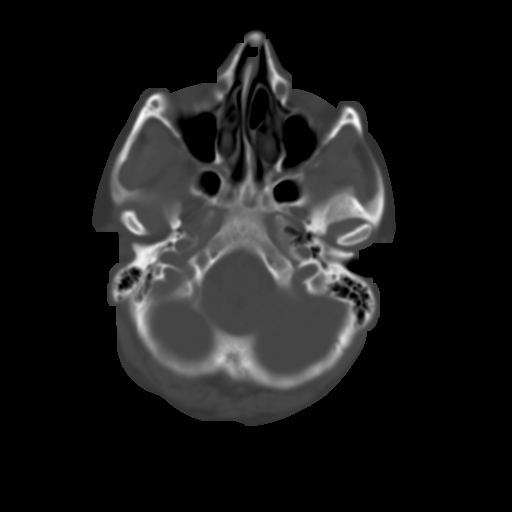
[im 7/29  brain]
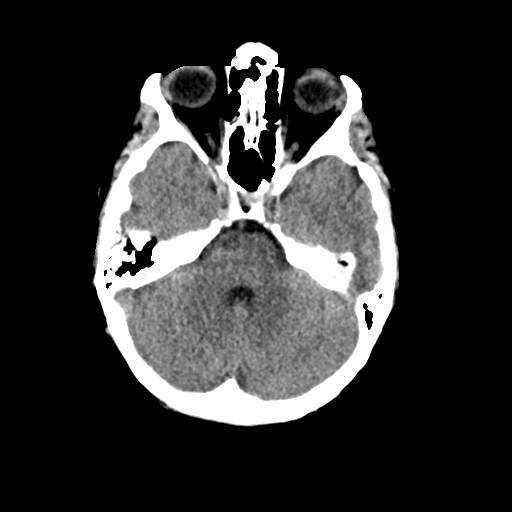
[im 10/29  brain]
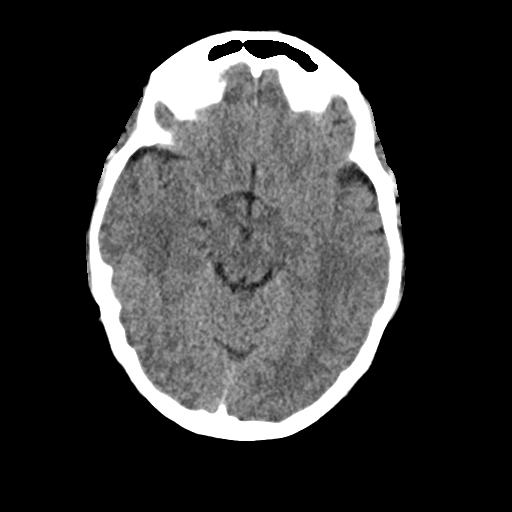
[im 13/29  brain]
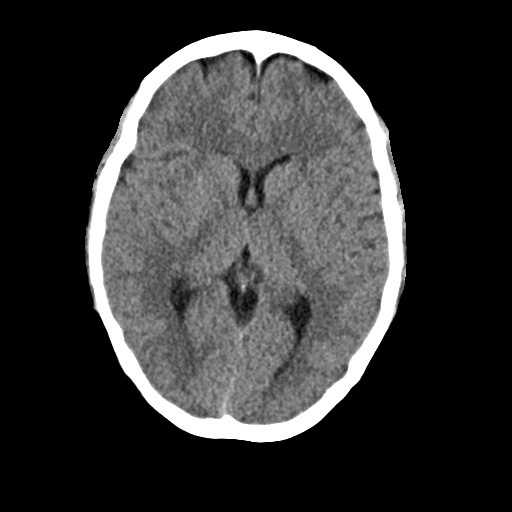
[im 17/29  brain]
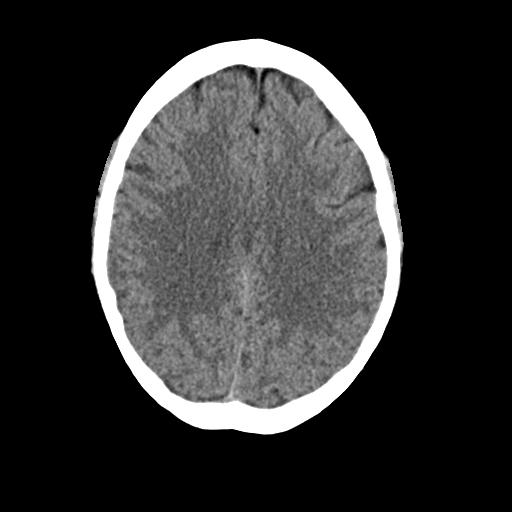
[im 17/29  bone]
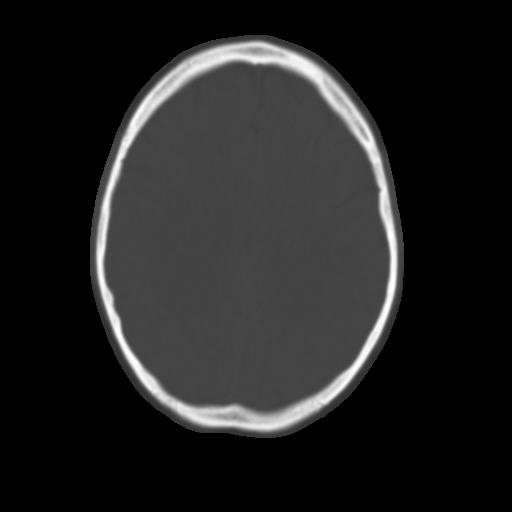
[im 20/29  brain]
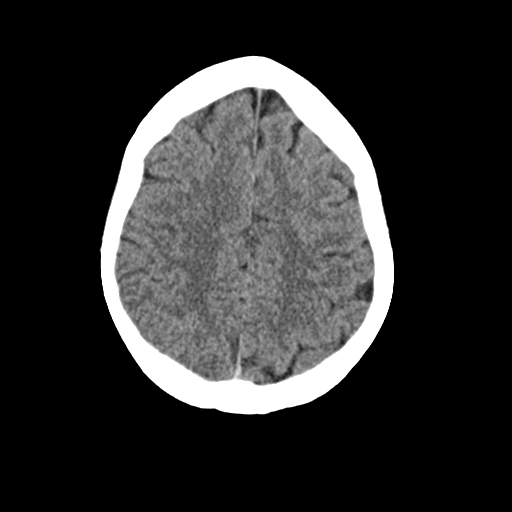
[im 23/29  brain]
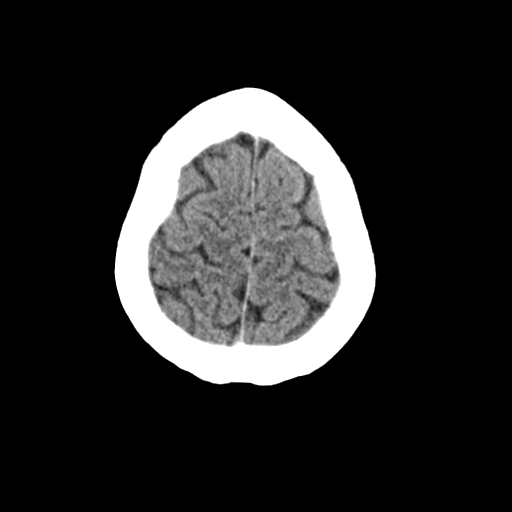
[im 27/29  brain]
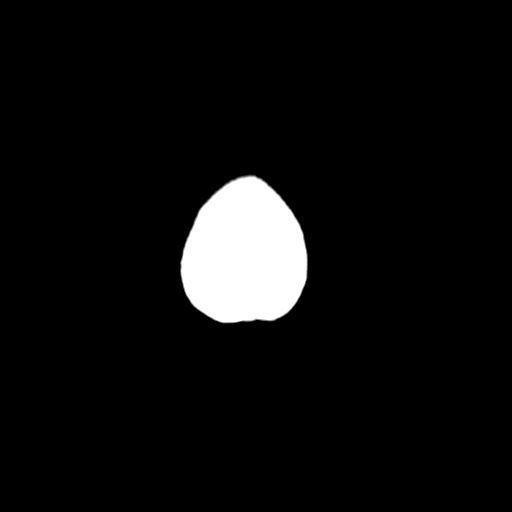

[Series 4: coronal soft · coronal · 0.31mm/px · 3 of 67 slices shown]
[im 23/67  brain]
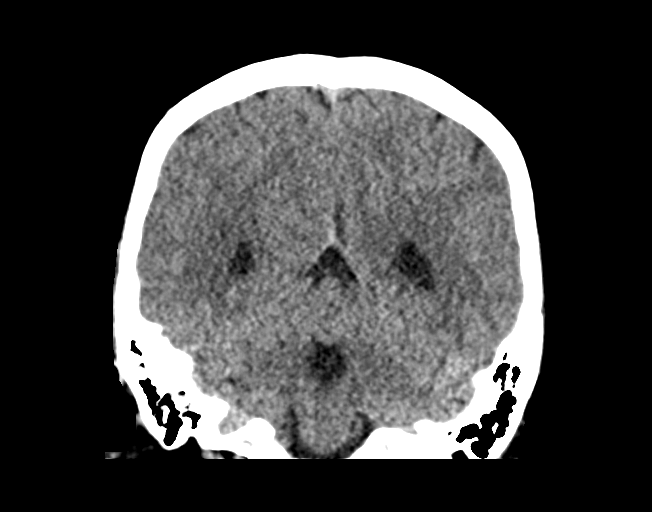
[im 30/67  brain]
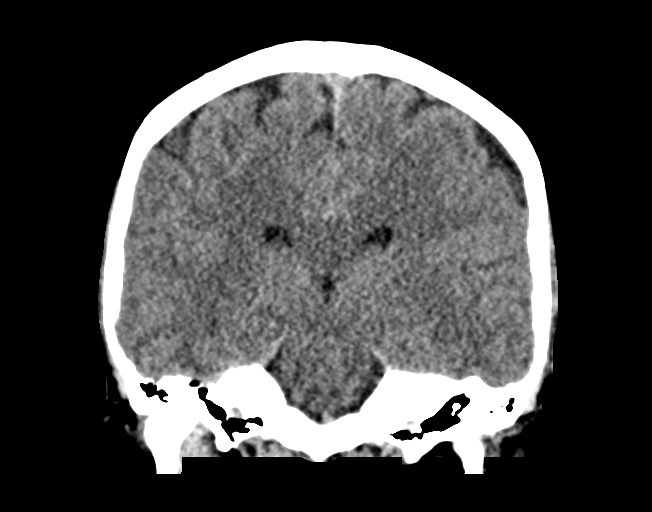
[im 37/67  brain]
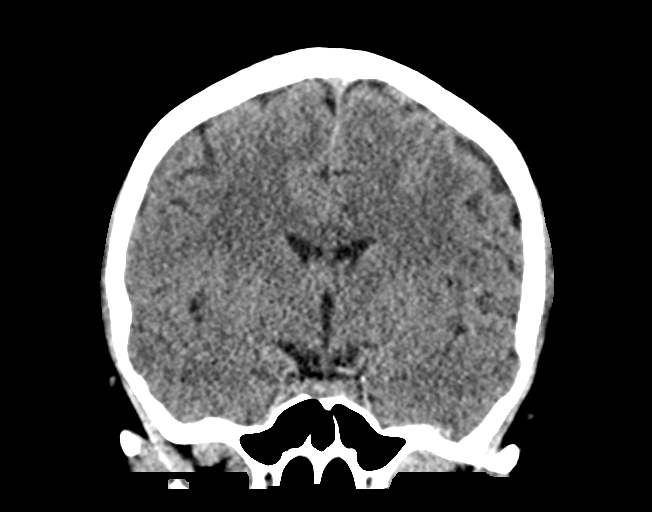

[Series 5: sag soft · sagittal · 0.28mm/px · 3 of 55 slices shown]
[im 19/55  brain]
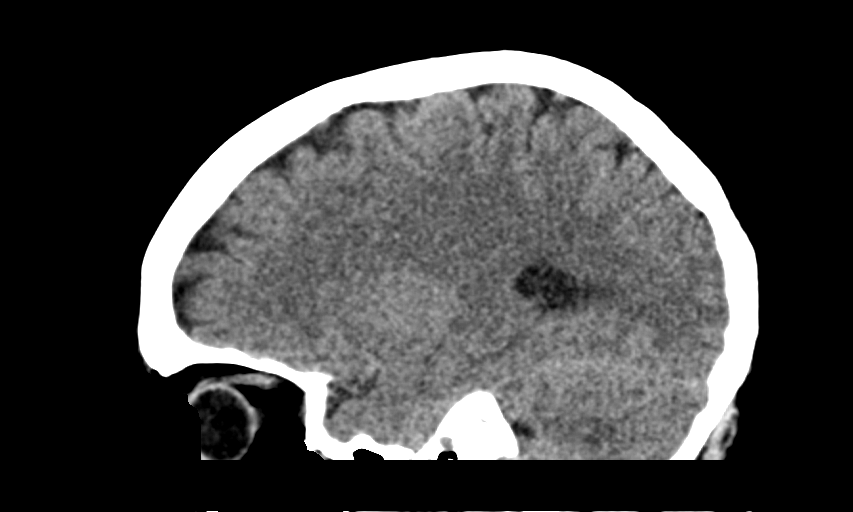
[im 28/55  brain]
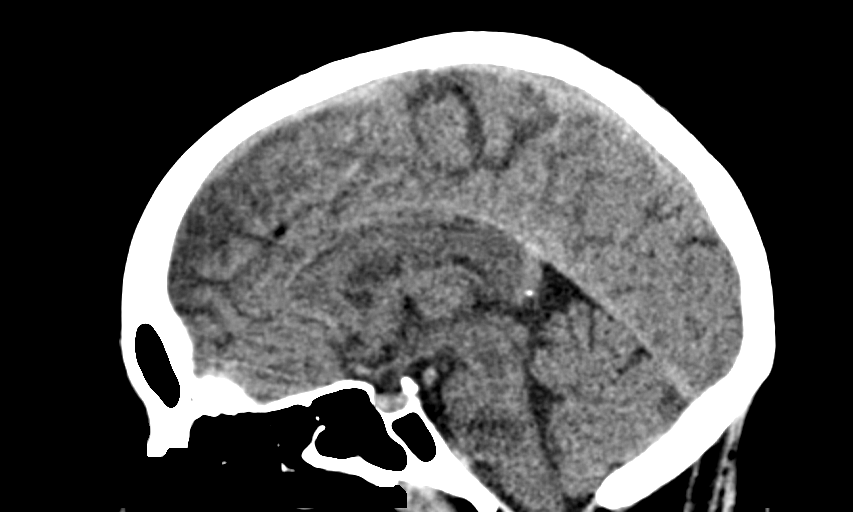
[im 37/55  brain]
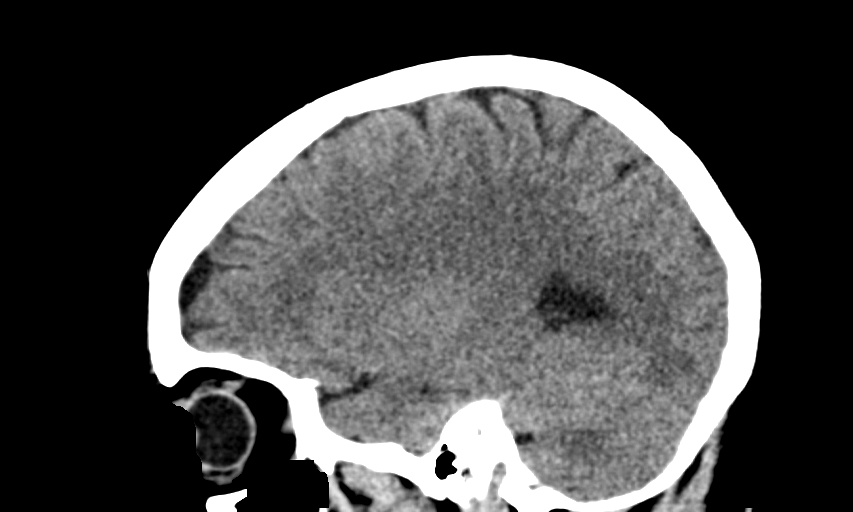

[14 of 46 positions shown; findings below may reference images not displayed]

FINDINGS: Brain: No evidence of acute infarction, hemorrhage, hydrocephalus,
extra-axial collection or mass lesion/mass effect.

Vascular: No hyperdense vessel or unexpected calcification.

Skull: Normal. Negative for fracture or focal lesion.

Sinuses/Orbits: No acute finding.

Other: None.
IMPRESSION: Normal head CT.

## 2019-10-08 ENCOUNTER — Other Ambulatory Visit: Payer: Self-pay | Admitting: Physician Assistant

## 2019-10-08 DIAGNOSIS — F3175 Bipolar disorder, in partial remission, most recent episode depressed: Secondary | ICD-10-CM

## 2020-01-16 ENCOUNTER — Other Ambulatory Visit: Payer: Self-pay | Admitting: Osteopathic Medicine

## 2020-01-16 DIAGNOSIS — F3175 Bipolar disorder, in partial remission, most recent episode depressed: Secondary | ICD-10-CM

## 2020-01-16 NOTE — Telephone Encounter (Signed)
Forwarding medication refill requests to the clinical pool for review. 

## 2020-02-24 ENCOUNTER — Encounter: Payer: Self-pay | Admitting: Medical-Surgical

## 2020-03-10 ENCOUNTER — Telehealth: Payer: Self-pay

## 2020-03-10 NOTE — Telephone Encounter (Signed)
Patient overdue for pap according to our records. Please contact to see if she has had this done in the last 3 years. If so, please respond with date and provider that completed it. If not, please offer to schedule appointment for pap smear to be done.  MyChart message sent but unread. 

## 2020-03-10 NOTE — Telephone Encounter (Signed)
Spoke with pt regarding health maintenance pap and she states she does not get pap smears and will not be getting one. Pt declined.

## 2020-03-10 NOTE — Telephone Encounter (Signed)
1 hour ago (1:33 PM)  Samuel Bouche, NP 1 hour ago (1:33 PM)     Patient overdue for pap according to our records. Please contact to see if she has had this done in the last 3 years. If so, please respond with date and provider that completed it. If not, please offer to schedule appointment for pap smear to be done.  MyChart message sent but unread.      Documentation   Samuel Bouche, NP  Chelsea Wells J 2 weeks ago   Records review indicates that you are past due for a pap smear for cervical cancer screening. If you have already had this completed, please respond to this message with the date and the provider that completed this. If you have not had this done, please call our office to schedule an appointment to have your pap smear completed.  Thank you,  Samuel Bouche, FNP  This MyChart message has not been read.

## 2020-04-28 NOTE — Progress Notes (Signed)
Subjective:    CC: Medication refills  HPI: Pleasant 44 year old female presenting today for medication refills.  Bipolar disorder- taking lexapro and latuda nightly. Feels that the latuda is helping symptoms well. Keeps her more stable. Increased anxiety lately. Mother died from colon cancer recently. Dad now bringing in a new girlfriend. This is causing lots of family angst. Also having some issues with her co-parent and custody of their child. Building a farm/house, costs are ridiculous. Sees counselor Johann Capers). No current psychiatry care. Denies SI/HI.  Tobacco use- Continues to use tobacco. Is aware of risks and health effects of tobacco use.  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 Avoids use of mouthwash and medications with an alcohol component. Also desires to avoid medications that have an addictive potential.  Headaches- Severe, occurring approximately three times a year immediately before starting her menstrual cycle. Accompanied by blurred vision, aura, photophobia, phonophobia, and nausea. Aura occurs approximately 65min prior to headache.  Mammogram scheduled. Declines to ever have pap smears done.   Requesting a referral for getting a colonoscopy done due to her mother's recent death.  I reviewed the past medical history, family history, social history, surgical history, and allergies today and no changes were needed.  Please see the problem list section below in epic for further details.  Past Medical History: Past Medical History:  Diagnosis Date  . Anxiety   . Arthritis   . Cardiac murmur   . Cat allergies   . Depressed   . OCD (obsessive compulsive disorder)   . POTS (postural orthostatic tachycardia syndrome)    Past Surgical History: Past Surgical History:  Procedure Laterality Date  . TONSILLECTOMY     Social History: Social History   Socioeconomic History  . Marital status: Single    Spouse name: Not on file  . Number  of children: Not on file  . Years of education: Not on file  . Highest education level: Not on file  Occupational History  . Not on file  Tobacco Use  . Smoking status: Current Every Day Smoker    Packs/day: 0.50    Years: 3.00    Pack years: 1.50  . Smokeless tobacco: Current User    Types: Chew  . Tobacco comment: quit 10/06  Substance and Sexual Activity  . Alcohol use: Not Currently    Comment: sober since 2018  . Drug use: No  . Sexual activity: Yes    Birth control/protection: None, Other-see comments    Comment: same-sex partner  Other Topics Concern  . Not on file  Social History Narrative   Soil scientist. Regularly exercises- swimming, biking. Careers adviser. Caffeine.    Social Determinants of Health   Financial Resource Strain:   . Difficulty of Paying Living Expenses:   Food Insecurity:   . Worried About Charity fundraiser in the Last Year:   . Arboriculturist in the Last Year:   Transportation Needs:   . Film/video editor (Medical):   Marland Kitchen Lack of Transportation (Non-Medical):   Physical Activity:   . Days of Exercise per Week:   . Minutes of Exercise per Session:   Stress:   . Feeling of Stress :   Social Connections:   . Frequency of Communication with Friends and Family:   . Frequency of Social Gatherings with Friends and Family:   . Attends Religious Services:   . Active Member of Clubs or Organizations:   . Attends Archivist  Meetings:   Marland Kitchen Marital Status:    Family History: Family History  Problem Relation Age of Onset  . Cancer Mother        skin  . Colon cancer Mother   . Breast cancer Paternal Grandmother    Allergies: Allergies  Allergen Reactions  . Shellfish Allergy Anaphylaxis  . Amoxicillin Rash    Blisters in mouth  . Azithromycin Rash  . Naproxen Rash    Blister in mouth   Medications: See med rec.  Review of Systems: See HPI for pertinent positives and negatives.   Depression screen [facility] 2/9  04/29/2020 04/07/2019 11/04/2018  Decreased Interest 0 0 0  Down, Depressed, Hopeless 2 0 0  PHQ - 2 Score 2 0 0  Altered sleeping 0 0 -  Tired, decreased energy 0 0 -  Change in appetite 0 0 -  Feeling bad or failure about yourself  0 0 -  Trouble concentrating 1 0 -  Moving slowly or fidgety/restless 0 0 -  Suicidal thoughts 0 0 -  PHQ-9 Score 3 0 -  Difficult doing work/chores Somewhat difficult - -   GAD 7 : Generalized Anxiety Score 04/29/2020 04/07/2019 11/04/2018  Nervous, Anxious, on Edge 3 0 1  Control/stop worrying 3 0 1  Worry too much - different things 3 0 1  Trouble relaxing 3 0 1  Restless 3 0 1  Easily annoyed or irritable 3 0 1  Afraid - awful might happen 3 0 0  Total GAD 7 Score 21 0 6  Anxiety Difficulty Very difficult - Somewhat difficult     Objective:    General: Well Developed, well nourished, and in no acute distress.  Neuro: Alert and oriented x3.  HEENT: Normocephalic, atraumatic.  Skin: Warm and dry. Cardiac: Regular rate and rhythm, no murmurs rubs or gallops, no lower extremity edema.  Respiratory: Clear to auscultation bilaterally. Not using accessory muscles, speaking in full sentences.   Impression and Recommendations:    1. Bipolar 1 disorder, depressed, partial remission (HCC) Continue Lexapro 20mg  daily and Latuda 80mg  daily. Continue counseling.  - escitalopram (LEXAPRO) 20 MG tablet; Take 1 tablet (20 mg total) by mouth daily.  Dispense: 90 tablet; Refill: 1 - lurasidone (LATUDA) 80 MG TABS tablet; Take 1 tablet (80 mg total) by mouth daily with breakfast.  Dispense: 90 tablet; Refill: 1  2. Migraine with aura and without status migrainosus, not intractable Discussed nature of migraines. Patient desires to avoid additional medications at this time. Recommend trialing Excedrin Migraine with the next headache to see if this helps.   3. Colon cancer screening Referral to GI for colonoscopy/consult - Ambulatory referral to  Gastroenterology  4. Screening for HIV (human immunodeficiency virus) Amenable to HIV screening at next blood draw.  - HIV Antibody (routine testing w rflx)  Return in about 3 months (around 07/30/2020) for mood follow up. ___________________________________________ Clearnce Sorrel, DNP, APRN, FNP-BC [facility] [place]

## 2020-04-29 ENCOUNTER — Encounter: Payer: Self-pay | Admitting: Medical-Surgical

## 2020-04-29 ENCOUNTER — Ambulatory Visit: Payer: BC Managed Care – PPO | Admitting: Medical-Surgical

## 2020-04-29 ENCOUNTER — Encounter: Payer: Self-pay | Admitting: Gastroenterology

## 2020-04-29 ENCOUNTER — Other Ambulatory Visit: Payer: Self-pay

## 2020-04-29 VITALS — BP 117/79 | HR 62 | Temp 97.7°F | Ht 66.0 in | Wt 191.0 lb

## 2020-04-29 DIAGNOSIS — Z114 Encounter for screening for human immunodeficiency virus [HIV]: Secondary | ICD-10-CM

## 2020-04-29 DIAGNOSIS — G43109 Migraine with aura, not intractable, without status migrainosus: Secondary | ICD-10-CM

## 2020-04-29 DIAGNOSIS — F3175 Bipolar disorder, in partial remission, most recent episode depressed: Secondary | ICD-10-CM

## 2020-04-29 DIAGNOSIS — Z1211 Encounter for screening for malignant neoplasm of colon: Secondary | ICD-10-CM | POA: Diagnosis not present

## 2020-04-29 HISTORY — DX: Migraine with aura, not intractable, without status migrainosus: G43.109

## 2020-04-29 MED ORDER — LURASIDONE HCL 80 MG PO TABS: 80.0000 mg | ORAL_TABLET | Freq: Every day | ORAL | 1 refills | Status: DC

## 2020-04-29 MED ORDER — ESCITALOPRAM OXALATE 20 MG PO TABS: 20.0000 mg | ORAL_TABLET | Freq: Every day | ORAL | 1 refills | Status: DC

## 2020-04-29 MED ORDER — HYDROXYZINE HCL 25 MG PO TABS: 25.0000 mg | ORAL_TABLET | Freq: Three times a day (TID) | ORAL | 2 refills | Status: DC | PRN

## 2020-05-10 ENCOUNTER — Other Ambulatory Visit (HOSPITAL_BASED_OUTPATIENT_CLINIC_OR_DEPARTMENT_OTHER): Payer: Self-pay | Admitting: Physician Assistant

## 2020-05-10 DIAGNOSIS — Z1231 Encounter for screening mammogram for malignant neoplasm of breast: Secondary | ICD-10-CM

## 2020-05-11 ENCOUNTER — Encounter (HOSPITAL_BASED_OUTPATIENT_CLINIC_OR_DEPARTMENT_OTHER): Payer: BC Managed Care – PPO

## 2020-05-11 DIAGNOSIS — Z1231 Encounter for screening mammogram for malignant neoplasm of breast: Secondary | ICD-10-CM

## 2020-06-09 ENCOUNTER — Ambulatory Visit: Payer: BC Managed Care – PPO | Admitting: Gastroenterology

## 2020-06-09 ENCOUNTER — Encounter: Payer: Self-pay | Admitting: Gastroenterology

## 2020-06-09 VITALS — BP 112/78 | HR 81 | Temp 98.2°F | Ht 66.0 in | Wt 194.1 lb

## 2020-06-09 DIAGNOSIS — Z01818 Encounter for other preprocedural examination: Secondary | ICD-10-CM

## 2020-06-09 DIAGNOSIS — Z800 Family history of malignant neoplasm of digestive organs: Secondary | ICD-10-CM

## 2020-06-09 DIAGNOSIS — Z1211 Encounter for screening for malignant neoplasm of colon: Secondary | ICD-10-CM | POA: Diagnosis not present

## 2020-06-09 MED ORDER — CLENPIQ 10-3.5-12 MG-GM -GM/160ML PO SOLN: 1.0000 | ORAL | 0 refills | Status: DC

## 2020-06-09 NOTE — Patient Instructions (Signed)
If you are age 42 or older, your body mass index should be between 23-30. Your Body mass index is 31.33 kg/m. If this is out of the aforementioned range listed, please consider follow up with your Primary Care Provider.  If you are age 42 or younger, your body mass index should be between 19-25. Your Body mass index is 31.33 kg/m. If this is out of the aformentioned range listed, please consider follow up with your Primary Care Provider.   You have been scheduled for a colonoscopy. Please follow written instructions given to you at your visit today.  Please pick up your prep supplies at the pharmacy within the next 1-3 days. If you use inhalers (even only as needed), please bring them with you on the day of your procedure. Your physician has requested that you go to www.startemmi.com and enter the access code given to you at your visit today. This web site gives a general overview about your procedure. However, you should still follow specific instructions given to you by our office regarding your preparation for the procedure.  We have sent the following medications to your pharmacy for you to pick up at your convenience: Clenpiq ( you have been given a coupon)  Thank you for choosing me and [facility].   Jackquline Denmark, MD

## 2020-06-09 NOTE — Progress Notes (Signed)
Chief Complaint: For colonoscopy  Referring Provider:  Samuel Bouche, NP      ASSESSMENT AND PLAN;   #1. FH colon cancer (mom Dx at  19, unfortunately died at age 42)  Plan: - Proceed with colonoscopy. Discussed risks & benefits. (Risks including rare perforation req laparotomy, bleeding after bx/polypectomy req blood transfusion, rarely missing neoplasms, risks of anesthesia/sedation). Benefits outweigh the risks. Patient agrees to proceed. All the questions were answered. Consent forms given for review.    HPI:    Chelsea Wells is a 44 y.o. female  For colonoscopy due to positive family history of colon cancer in a first-degree relative-mother at age 85, unfortunately passed away due to stage IV colon cancer at age 31. No nausea, vomiting, heartburn, regurgitation, odynophagia or dysphagia.  No significant diarrhea or constipation.  No melena or hematochezia. No unintentional weight loss. No abdominal pain.  Sister is RN in ED here @ [place]   Past Medical History:  Diagnosis Date  . Alcoholism ([place])    recovering   . Anxiety   . Arthritis   . Cardiac murmur   . Cat allergies   . Depressed   . Drug abuse ([place])   . OCD (obsessive compulsive disorder)   . POTS (postural orthostatic tachycardia syndrome)     Past Surgical History:  Procedure Laterality Date  . TONSILLECTOMY      Family History  Problem Relation Age of Onset  . Cancer Mother        skin  . Colon cancer Mother   . Breast cancer Paternal Grandmother   . Esophageal cancer Neg Hx     Social History   Tobacco Use  . Smoking status: Current Every Day Smoker    Packs/day: 0.50    Years: 3.00    Pack years: 1.50  . Smokeless tobacco: Current User    Types: Chew  . Tobacco comment: quit 10/06  Vaping Use  . Vaping Use: Never used  Substance Use Topics  . Alcohol use: Not Currently    Comment: sober since 2018  . Drug use: No    Current Outpatient Medications  Medication Sig Dispense  Refill  . escitalopram (LEXAPRO) 20 MG tablet Take 1 tablet (20 mg total) by mouth daily. 90 tablet 1  . hydrOXYzine (ATARAX/VISTARIL) 25 MG tablet Take 1 tablet (25 mg total) by mouth 3 (three) times daily as needed. (Patient taking differently: Take 25 mg by mouth daily as needed. ) 30 tablet 2  . lurasidone (LATUDA) 80 MG TABS tablet Take 1 tablet (80 mg total) by mouth daily with breakfast. (Patient taking differently: Take 80 mg by mouth at bedtime. ) 90 tablet 1   No current facility-administered medications for this visit.    Allergies  Allergen Reactions  . Shellfish Allergy Anaphylaxis  . Amoxicillin Rash    Blisters in mouth  . Azithromycin Rash  . Naproxen Rash    Blister in mouth    Review of Systems:  Constitutional: Denies fever, chills, diaphoresis, appetite change and fatigue.  HEENT: Denies photophobia, eye pain, redness, hearing loss, ear pain, congestion, sore throat, rhinorrhea, sneezing, mouth sores, neck pain, neck stiffness and tinnitus.   Respiratory: Denies SOB, DOE, cough, chest tightness,  and wheezing.   Cardiovascular: Denies chest pain, palpitations and leg swelling.  Genitourinary: Denies dysuria, urgency, frequency, hematuria, flank pain and difficulty urinating.  Musculoskeletal: Denies myalgias, back pain, joint swelling, arthralgias and gait problem.  Skin: No rash.  Neurological: Denies  dizziness, seizures, syncope, weakness, light-headedness, numbness and headaches.  Hematological: Denies adenopathy. Easy bruising, personal or family bleeding history  Psychiatric/Behavioral: Has anxiety or depression     Physical Exam:    BP 112/78   Pulse 81   Temp 98.2 F (36.8 C)   Ht 5\' 6"  (1.676 m)   Wt 194 lb 2 oz (88.1 kg)   BMI 31.33 kg/m  Wt Readings from Last 3 Encounters:  06/09/20 194 lb 2 oz (88.1 kg)  04/29/20 191 lb (86.6 kg)  11/04/18 194 lb (88 kg)   Constitutional:  Well-developed, in no acute distress. Psychiatric: Normal mood and  affect. Behavior is normal. HEENT: Pupils normal.  Conjunctivae are normal. No scleral icterus. Neck supple.  Cardiovascular: Normal rate, regular rhythm. No edema Pulmonary/chest: Effort normal and breath sounds normal. No wheezing, rales or rhonchi. Abdominal: Soft, nondistended. Nontender. Bowel sounds active throughout. There are no masses palpable. No hepatomegaly. Rectal:  defered Neurological: Alert and oriented to person place and time. Skin: Skin is warm and dry. No rashes noted.  Data Reviewed: I have personally reviewed following labs and imaging studies    Carmell Austria, MD 06/09/2020, 9:18 AM  Cc: Samuel Bouche, NP

## 2020-07-14 ENCOUNTER — Encounter: Payer: Self-pay | Admitting: Gastroenterology

## 2020-07-15 ENCOUNTER — Encounter: Payer: Self-pay | Admitting: Medical-Surgical

## 2020-07-15 ENCOUNTER — Ambulatory Visit (INDEPENDENT_AMBULATORY_CARE_PROVIDER_SITE_OTHER): Payer: BC Managed Care – PPO | Admitting: Medical-Surgical

## 2020-07-15 VITALS — BP 107/70 | HR 71 | Temp 98.0°F | Ht 66.0 in | Wt 186.1 lb

## 2020-07-15 DIAGNOSIS — Z0000 Encounter for general adult medical examination without abnormal findings: Secondary | ICD-10-CM | POA: Diagnosis not present

## 2020-07-15 DIAGNOSIS — Z111 Encounter for screening for respiratory tuberculosis: Secondary | ICD-10-CM | POA: Diagnosis not present

## 2020-07-15 DIAGNOSIS — Z1159 Encounter for screening for other viral diseases: Secondary | ICD-10-CM

## 2020-07-15 NOTE — Progress Notes (Signed)
HPI: Chelsea Wells is a 44 y.o. female who  has a past medical history of Alcoholism ([place]), Anxiety, Arthritis, Cardiac murmur, Cat allergies, Depressed, Drug abuse ([place]), OCD (obsessive compulsive disorder), and POTS (postural orthostatic tachycardia syndrome).  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 Migraine with aura and without status migrainosus, not intractable 04/29/2020  . Family history of colon cancer in mother 11/04/2018  . Bipolar 1 disorder, depressed, partial remission ([place]) 11/04/2018  . Shellfish allergy 11/04/2018  . Moderate alcohol use disorder, in sustained remission ([place]) 11/04/2018  . Chewing tobacco nicotine dependence without complication 96/22/2979  . Lightheadedness 08/10/2011  . Syncope 07/23/2011  . CARDIAC MURMUR 08/18/2009    Past Surgical History:  Procedure Laterality Date  . TONSILLECTOMY      Social History   Tobacco Use  . Smoking status: Current Every Day Smoker    Packs/day: 0.50    Years: 3.00    Pack years: 1.50  . Smokeless tobacco: Current User    Types: Chew  . Tobacco comment: quit 10/06  Substance Use Topics  . Alcohol use: Not Currently    Comment: sober since 2018    Family History  Problem Relation Age of Onset  . Cancer Mother        skin  . Colon cancer Mother   . Breast cancer Paternal Grandmother   . Esophageal cancer Neg Hx      Current medication list and allergy/intolerance  information reviewed:    Current Outpatient Medications  Medication Sig Dispense Refill  . escitalopram (LEXAPRO) 20 MG tablet Take 1 tablet (20 mg total) by mouth daily. 90 tablet 1  . hydrOXYzine (ATARAX/VISTARIL) 25 MG tablet Take 1 tablet (25 mg total) by mouth 3 (three) times daily as needed. (Patient taking differently: Take 25 mg by mouth daily as needed. ) 30 tablet 2  . lurasidone (LATUDA) 80 MG TABS tablet Take 1 tablet (80 mg total) by mouth daily with breakfast. (Patient taking differently: Take 80 mg by mouth at bedtime. ) 90 tablet 1   No current facility-administered medications for this visit.    Allergies  Allergen Reactions  . Shellfish Allergy Anaphylaxis  . Amoxicillin Rash    Blisters in mouth  . Azithromycin Rash  . Naproxen Rash    Blister in mouth      Review of Systems:  Constitutional:  No  fever, no chills, No recent illness, No unintentional weight changes. No significant fatigue.   HEENT: No  headache, no vision change, no hearing change, No sore throat, No  sinus pressure  Cardiac: No  chest pain, No  pressure, No palpitations, No  Orthopnea  Respiratory:  No  shortness of breath. No  Cough  Gastrointestinal: No  abdominal pain, No  nausea, No  vomiting,  No  blood in stool, No  diarrhea, No  constipation   Musculoskeletal: No new myalgia/arthralgia  Skin: No  Rash, No other wounds/concerning lesions  Genitourinary: No  incontinence,  No  abnormal genital bleeding, No abnormal genital discharge  Hem/Onc: No  easy bruising/bleeding, No  abnormal lymph node  Endocrine: No cold intolerance,  No heat intolerance. No polyuria/polydipsia/polyphagia   Neurologic: No  weakness, No  dizziness, No  slurred speech/focal weakness/facial droop  Psychiatric: No  concerns with depression, No  concerns with anxiety, No sleep problems, No mood problems  Exam:  BP 107/70   Pulse 71   Temp 98 F (36.7 C) (Oral)   Ht _0  (1.676 m)   Wt 186 lb 1.6 oz  (84.4 kg)   LMP 06/06/2020   SpO2 97%   BMI 30.04 kg/m   Constitutional: VS see above. General Appearance: alert, well-developed, well-nourished, NAD  Eyes: Normal lids and conjunctive, non-icteric sclera  Ears, Nose, Mouth, Throat: MMM, Normal external inspection ears/nares/mouth/lips/gums. TM normal bilaterally. Pharynx/tonsils no erythema, no exudate. Nasal mucosa normal.   Neck: No masses, trachea midline. No thyroid enlargement. No tenderness/mass appreciated. No lymphadenopathy  Respiratory: Normal respiratory effort. no wheeze, no rhonchi, no rales  Cardiovascular: S1/S2 normal, no murmur, no rub/gallop auscultated. RRR. No lower extremity edema. Pedal pulse II/IV bilaterally DP and PT. No carotid bruit or JVD. No abdominal aortic bruit.  Gastrointestinal: Nontender, no masses. No hepatomegaly, no splenomegaly. No hernia appreciated. Bowel sounds normal. Rectal exam deferred.   Musculoskeletal: Gait normal. No clubbing/cyanosis of digits.   Neurological: Normal balance/coordination. No tremor. No cranial nerve deficit on limited exam. Motor and sensation intact and symmetric. Cerebellar reflexes intact.   Skin: warm, dry, intact. No rash/ulcer. No concerning nevi or subq nodules on limited exam.    Psychiatric: Normal judgment/insight. Normal mood and affect. Oriented x3.    No results found for this or any previous visit (from the past 72 hour(s)).  No results found.   ASSESSMENT/PLAN:   1. Annual physical exam Checking CBC, CMP, and lipid panel.  Encouraged dental cleanings every 6 months and yearly eye exam.  Declines Pap smear. - CBC - COMPLETE METABOLIC PANEL WITH GFR - Lipid panel  2. Screening for tuberculosis We will go ahead and check a QuantiFERON TB Gold today as her school will require it. - QuantiFERON-TB Gold Plus  3. Encounter for hepatitis C screening test for low risk patient Discussed screening recommendations with patient.  Adding screening to  blood work today. - Hepatitis C antibody   Orders Placed This Encounter  Procedures  . Hepatitis C antibody  . CBC  . COMPLETE METABOLIC PANEL WITH GFR  . Lipid panel  . QuantiFERON-TB Gold Plus    No orders of the defined types were placed in this encounter.   Patient Instructions  Preventive Care 86-57 Years Old, Female Preventive care refers to visits with your health care provider and lifestyle choices that can promote health and wellness. This includes:  A yearly physical exam. This may also be called an annual well check.  Regular dental visits and eye exams.  Immunizations.  Screening for certain conditions.  Healthy lifestyle choices, such as eating a healthy diet, getting regular exercise, not using drugs or products that contain nicotine and tobacco, and limiting alcohol use. What can I expect for my preventive care visit? Physical exam Your health care provider will check your:  Height and weight. This may be used to calculate body mass index (BMI), which tells if you are at a healthy weight.  Heart rate and blood pressure.  Skin for abnormal spots. Counseling Your health care provider may ask you questions about your:  Alcohol, tobacco, and drug use.  Emotional well-being.  Home and relationship well-being.  Sexual activity.  Eating habits.  Work and work Statistician.  Method of birth control.  Menstrual cycle.  Pregnancy history. What immunizations do I need?  Influenza (flu) vaccine  This is recommended every year. Tetanus, diphtheria, and pertussis (Tdap) vaccine  You may need a Td booster every 10 years. Varicella (chickenpox) vaccine  You may need this if you have not been vaccinated. Zoster (shingles) vaccine  You may need this after age 82. Measles, mumps, and rubella (MMR) vaccine  You may need at least one dose of MMR if you were born in 1957 or later. You may also need a second dose. Pneumococcal conjugate (PCV13)  vaccine  You may need this if you have certain conditions and were not previously vaccinated. Pneumococcal polysaccharide (PPSV23) vaccine  You may need one or two doses if you smoke cigarettes or if you have certain conditions. Meningococcal conjugate (MenACWY) vaccine  You may need this if you have certain conditions. Hepatitis A vaccine  You may need this if you have certain conditions or if you travel or work in places where you may be exposed to hepatitis A. Hepatitis B vaccine  You may need this if you have certain conditions or if you travel or work in places where you may be exposed to hepatitis B. Haemophilus influenzae type b (Hib) vaccine  You may need this if you have certain conditions. Human papillomavirus (HPV) vaccine  If recommended by your health care provider, you may need three doses over 6 months. You may receive vaccines as individual doses or as more than one vaccine together in one shot (combination vaccines). Talk with your health care provider about the risks and benefits of combination vaccines. What tests do I need? Blood tests  Lipid and cholesterol levels. These may be checked every 5 years, or more frequently if you are over 1 years old.  Hepatitis C test.  Hepatitis B test. Screening  Lung cancer screening. You may have this screening every year starting at age 27 if you have a 30-pack-year history of smoking and currently smoke or have quit within the past 15 years.  Colorectal cancer screening. All adults should have this screening starting at age 39 and continuing until age 47. Your health care provider may recommend screening at age 38 if you are at increased risk. You will have tests every 1-10 years, depending on your results and the type of screening test.  Diabetes screening. This is done by checking your blood sugar (glucose) after you have not eaten for a while (fasting). You may have this done every 1-3 years.  Mammogram. This may be  done every 1-2 years. Talk with your health care provider about when you should start having regular mammograms. This may depend on whether you have a family history of breast cancer.  BRCA-related cancer screening. This may be done if you have a family history of breast, ovarian, tubal, or peritoneal cancers.  Pelvic exam and Pap test. This may be done every 3 years starting at age 43. Starting at age 36, this may be done every 5 years if you have a Pap test in combination with an HPV test. Other tests  Sexually transmitted disease (STD) testing.  Bone density scan. This is done to screen for osteoporosis. You may have this scan if you are at high risk for osteoporosis. Follow these instructions at home: Eating and drinking  Eat a diet  that includes fresh fruits and vegetables, whole grains, lean protein, and low-fat dairy.  Take vitamin and mineral supplements as recommended by your health care provider.  Do not drink alcohol if: ? Your health care provider tells you not to drink. ? You are pregnant, may be pregnant, or are planning to become pregnant.  If you drink alcohol: ? Limit how much you have to 0-1 drink a day. ? Be aware of how much alcohol is in your drink. In the [place] one drink equals one 12 oz bottle of beer (355 mL), one 5 oz glass of wine (148 mL), or one 1 oz glass of hard liquor (44 mL). Lifestyle  Take daily care of your teeth and gums.  Stay active. Exercise for at least 30 minutes on 5 or more days each week.  Do not use any products that contain nicotine or tobacco, such as cigarettes, e-cigarettes, and chewing tobacco. If you need help quitting, ask your health care provider.  If you are sexually active, practice safe sex. Use a condom or other form of birth control (contraception) in order to prevent pregnancy and STIs (sexually transmitted infections).  If told by your health care provider, take low-dose aspirin daily starting at age 76. What's  next?  Visit your health care provider once a year for a well check visit.  Ask your health care provider how often you should have your eyes and teeth checked.  Stay up to date on all vaccines. This information is not intended to replace advice given to you by your health care provider. Make sure you discuss any questions you have with your health care provider. Document Revised: 08/15/2018 Document Reviewed: 08/15/2018 Elsevier Patient Education  [place].  Follow-up plan: Return in about 1 year (around 07/15/2021) for annual physical exam or sooner if needed.  Clearnce Sorrel, DNP, APRN, FNP-BC [facility]

## 2020-07-15 NOTE — Patient Instructions (Signed)

## 2020-07-23 ENCOUNTER — Other Ambulatory Visit: Payer: Self-pay | Admitting: Gastroenterology

## 2020-07-23 ENCOUNTER — Ambulatory Visit (INDEPENDENT_AMBULATORY_CARE_PROVIDER_SITE_OTHER): Payer: BC Managed Care – PPO

## 2020-07-23 DIAGNOSIS — Z1159 Encounter for screening for other viral diseases: Secondary | ICD-10-CM

## 2020-07-23 LAB — SARS CORONAVIRUS 2 (TAT 6-24 HRS): SARS Coronavirus 2: NEGATIVE

## 2020-07-27 ENCOUNTER — Encounter: Payer: Self-pay | Admitting: Gastroenterology

## 2020-07-27 ENCOUNTER — Other Ambulatory Visit: Payer: Self-pay

## 2020-07-27 ENCOUNTER — Ambulatory Visit (AMBULATORY_SURGERY_CENTER): Payer: BC Managed Care – PPO | Admitting: Gastroenterology

## 2020-07-27 VITALS — BP 135/78 | HR 63 | Temp 98.4°F | Resp 20 | Ht 66.0 in | Wt 194.0 lb

## 2020-07-27 DIAGNOSIS — Z1211 Encounter for screening for malignant neoplasm of colon: Secondary | ICD-10-CM

## 2020-07-27 DIAGNOSIS — Z800 Family history of malignant neoplasm of digestive organs: Secondary | ICD-10-CM

## 2020-07-27 DIAGNOSIS — D124 Benign neoplasm of descending colon: Secondary | ICD-10-CM

## 2020-07-27 MED ORDER — SODIUM CHLORIDE 0.9 % IV SOLN: 500.0000 mL | Freq: Once | INTRAVENOUS | Status: DC

## 2020-07-27 NOTE — Patient Instructions (Signed)

## 2020-07-27 NOTE — Progress Notes (Signed)
Vitals-CW  Patient had sips of water at 0700. Dr notified.   [facility] said that it was ok. They brought the patient back.

## 2020-07-27 NOTE — Progress Notes (Signed)
Called to room to assist during endoscopic procedure.  Patient ID and intended procedure confirmed with present staff. Received instructions for my participation in the procedure from the performing physician.  

## 2020-07-27 NOTE — Progress Notes (Signed)
PT taken to PACU. Monitors in place. VSS. Report given to RN. 

## 2020-07-27 NOTE — Op Note (Signed)
[facility] Patient Name: Chelsea Wells Procedure Date: 07/27/2020 8:16 AM MRN: [id number] Endoscopist: Jackquline Denmark , MD Age: 44 Referring MD:  Date of Birth: [birth year] Gender: Female Account #: [id number] Procedure:                Colonoscopy Indications:              Screening in patient at increased risk: Colorectal                            cancer in mother 68 or older Medicines:                Monitored Anesthesia Care Procedure:                Pre-Anesthesia Assessment:                           - Prior to the procedure, a History and Physical                            was performed, and patient medications and                            allergies were reviewed. The patient's tolerance of                            previous anesthesia was also reviewed. The risks                            and benefits of the procedure and the sedation                            options and risks were discussed with the patient.                            All questions were answered, and informed consent                            was obtained. Prior Anticoagulants: The patient has                            taken no previous anticoagulant or antiplatelet                            agents. ASA Grade Assessment: II - A patient with                            mild systemic disease. After reviewing the risks                            and benefits, the patient was deemed in                            satisfactory condition to undergo the procedure.  After obtaining informed consent, the colonoscope                            was passed under direct vision. Throughout the                            procedure, the patient's blood pressure, pulse, and                            oxygen saturations were monitored continuously. The                            Colonoscope was introduced through the anus and                            advanced to the 2 cm into the  ileum. The                            colonoscopy was performed without difficulty. The                            patient tolerated the procedure well. The quality                            of the bowel preparation was good. The terminal                            ileum, ileocecal valve, appendiceal orifice, and                            rectum were photographed. Scope In: 8:28:22 AM Scope Out: 8:43:58 AM Scope Withdrawal Time: 0 hours 9 minutes 36 seconds  Total Procedure Duration: 0 hours 15 minutes 36 seconds  Findings:                 Two sessile polyps were found in the mid descending                            colon. The polyps were 6 to 8 mm in size. These                            polyps were removed with a cold snare. Resection                            and retrieval were complete.                           Non-bleeding internal hemorrhoids were found during                            retroflexion. The hemorrhoids were small.                           The terminal ileum appeared normal.  The exam was otherwise without abnormality on                            direct and retroflexion views. Complications:            No immediate complications. Estimated Blood Loss:     Estimated blood loss: none. Impression:               - Two 6 to 8 mm polyps in the mid descending colon,                            removed with a cold snare. Resected and retrieved.                           - Non-bleeding internal hemorrhoids.                           - The examined portion of the ileum was normal.                           - The examination was otherwise normal on direct                            and retroflexion views. Recommendation:           - Patient has a contact number available for                            emergencies. The signs and symptoms of potential                            delayed complications were discussed with the                             patient. Return to normal activities tomorrow.                            Written discharge instructions were provided to the                            patient.                           - Resume previous diet.                           - Continue present medications.                           - Await pathology results.                           - Repeat colonoscopy likely in 5 years for                            surveillance based on pathology results.                           -  Return to GI clinic PRN.                           - The findings and recommendations were discussed                            with the patient's sister Seth Bake. Jackquline Denmark, MD 07/27/2020 8:55:21 AM This report has been signed electronically.

## 2020-07-29 ENCOUNTER — Telehealth: Payer: Self-pay | Admitting: *Deleted

## 2020-07-29 ENCOUNTER — Telehealth: Payer: Self-pay

## 2020-07-29 NOTE — Telephone Encounter (Signed)
Attempted f/u phone call. No answer. Mailbox full, unable to leave message.  

## 2020-07-29 NOTE — Telephone Encounter (Signed)
  Follow up Call-  Call back number 07/27/2020  Post procedure Call Back phone  # [phone]  Permission to leave phone message Yes  Some recent data might be hidden    Attempted to leave message but mailbox was full.

## 2020-07-30 ENCOUNTER — Ambulatory Visit: Payer: BC Managed Care – PPO | Admitting: Medical-Surgical

## 2020-08-03 ENCOUNTER — Encounter: Payer: Self-pay | Admitting: Gastroenterology

## 2020-09-08 ENCOUNTER — Emergency Department (HOSPITAL_BASED_OUTPATIENT_CLINIC_OR_DEPARTMENT_OTHER)
Admission: EM | Admit: 2020-09-08 | Discharge: 2020-09-08 | Disposition: A | Discharge status: Home / Self Care | Payer: BC Managed Care – PPO | Attending: Emergency Medicine | Admitting: Emergency Medicine

## 2020-09-08 ENCOUNTER — Other Ambulatory Visit: Payer: Self-pay

## 2020-09-08 ENCOUNTER — Encounter (HOSPITAL_BASED_OUTPATIENT_CLINIC_OR_DEPARTMENT_OTHER): Payer: Self-pay | Admitting: *Deleted

## 2020-09-08 DIAGNOSIS — F419 Anxiety disorder, unspecified: Secondary | ICD-10-CM | POA: Insufficient documentation

## 2020-09-08 DIAGNOSIS — F17220 Nicotine dependence, chewing tobacco, uncomplicated: Secondary | ICD-10-CM | POA: Insufficient documentation

## 2020-09-08 DIAGNOSIS — F411 Generalized anxiety disorder: Secondary | ICD-10-CM

## 2020-09-08 MED ORDER — HYDROXYZINE HCL 25 MG PO TABS: 25.0000 mg | ORAL_TABLET | Freq: Every day | ORAL | 0 refills | Status: DC | PRN

## 2020-09-08 NOTE — Discharge Instructions (Addendum)
Vistaril as needed as prescribed.  Follow up with counselor as discussed. Contact crisis line as needed.

## 2020-09-08 NOTE — ED Triage Notes (Signed)
Pt is calm and cooperative and states that she was having an "emotional breakdown".  Pt states that she is on latuda and lexapro.  Pt reports stressors of being in the process of selling a house, getting a well on her new land, her truck broke down and she had to purchase a new vehicle.  Pt also had to take her puppy to the vet and they are keeping it overnight to see if it will get better on its own or require surgery.  Pt expresses that it has all been "a lot" and that since she is already on medication and a recovered alcoholic she wanted to come get help.  Pt denies any SI/HI.

## 2020-09-08 NOTE — ED Notes (Signed)
Is calm and cooperative, does have hx of alcohol abuse, is dealing with a lot of stressors in her life

## 2020-09-08 NOTE — ED Provider Notes (Signed)
[facility] EMERGENCY DEPARTMENT Provider Note   CSN: [id number] Arrival date & time: 09/08/20  1651     History Chief Complaint  Patient presents with  . Anxiety    Chelsea Wells is a 44 y.o. female.  44 year old female presents with complaint of having an "emotional breakdown".  Pt states that she is on latuda and lexapro.  Pt reports stressors of being in the process of selling a house, getting a well on her new land, her truck broke down and she had to purchase a new vehicle.  Pt also had to take her puppy to the vet and they are keeping it overnight to see if it will get better on its own or require surgery.  Pt expresses that it has all been "a lot" and that since she is already on medication and a recovered alcoholic she wanted to come get help.  Pt denies any SI/HI. Patient states she goes to [employer] nightly and plans to go tonight, has a sponsor and a sponsor family. Patient plans to call a counselor in the morning as well as her PCP for a refill of her Vistaril which she takes for anxiety as needed but only has 3 left. No other complaints or concerns today.        Past Medical History:  Diagnosis Date  . Alcoholism ([place])    recovering   . Anxiety   . Arthritis   . Cardiac murmur   . Cat allergies   . Depressed   . Drug abuse ([place])   . OCD (obsessive compulsive disorder)   . POTS (postural orthostatic tachycardia syndrome)     Patient Active Problem List   Diagnosis Date Noted  . Migraine with aura and without status migrainosus, not intractable 04/29/2020  . Family history of colon cancer in mother 11/04/2018  . Bipolar 1 disorder, depressed, partial remission ([place]) 11/04/2018  . Shellfish allergy 11/04/2018  . Moderate alcohol use disorder, in sustained remission ([place]) 11/04/2018  . Chewing tobacco nicotine dependence without complication 99/24/2683  . Lightheadedness 08/10/2011  . Syncope 07/23/2011  . CARDIAC MURMUR 08/18/2009    Past  Surgical History:  Procedure Laterality Date  . TONSILLECTOMY       OB History    Gravida  0   Para  0   Term  0   Preterm  0   AB  0   Living  0     SAB  0   TAB  0   Ectopic  0   Multiple  0   Live Births  0           Family History  Problem Relation Age of Onset  . Cancer Mother        skin  . Colon cancer Mother   . Breast cancer Paternal Grandmother   . Esophageal cancer Neg Hx     Social History   Tobacco Use  . Smoking status: Current Every Day Smoker    Packs/day: 0.50    Years: 3.00    Pack years: 1.50  . Smokeless tobacco: Current User    Types: Chew  . Tobacco comment: quit 10/06  Vaping Use  . Vaping Use: Never used  Substance Use Topics  . Alcohol use: Not Currently    Comment: sober since 2018  . Drug use: No    Home Medications Prior to Admission medications   Medication Sig Start Date End Date Taking? Authorizing Provider  escitalopram (  LEXAPRO) 20 MG tablet Take 1 tablet (20 mg total) by mouth daily. 04/29/20   Samuel Bouche, NP  hydrOXYzine (ATARAX/VISTARIL) 25 MG tablet Take 1 tablet (25 mg total) by mouth daily as needed for anxiety. 09/08/20   Tacy Learn, PA-C  lurasidone (LATUDA) 80 MG TABS tablet Take 1 tablet (80 mg total) by mouth daily with breakfast. Patient taking differently: Take 80 mg by mouth at bedtime.  04/29/20   Samuel Bouche, NP    Allergies    Shellfish allergy, Amoxicillin, Azithromycin, and Naproxen  Review of Systems   Review of Systems  Constitutional: Negative for fever.  Respiratory: Negative for shortness of breath.   Cardiovascular: Negative for chest pain.  Gastrointestinal: Negative for abdominal pain.  Allergic/Immunologic: Negative for immunocompromised state.  Psychiatric/Behavioral: Negative for sleep disturbance and suicidal ideas. The patient is nervous/anxious.   All other systems reviewed and are negative.   Physical Exam Updated Vital Signs BP (!) 130/95 (BP Location: Right  Arm)   Pulse 70   Temp 98.2 F (36.8 C) (Oral)   Resp 16   Wt 87.1 kg   SpO2 99%   BMI 30.99 kg/m   Physical Exam Vitals and nursing note reviewed.  Constitutional:      General: She is not in acute distress.    Appearance: She is well-developed. She is not diaphoretic.  HENT:     Head: Normocephalic and atraumatic.  Cardiovascular:     Rate and Rhythm: Normal rate and regular rhythm.     Pulses: Normal pulses.     Heart sounds: Normal heart sounds.  Pulmonary:     Effort: Pulmonary effort is normal.     Breath sounds: Normal breath sounds.  Skin:    General: Skin is warm and dry.     Findings: No erythema or rash.  Neurological:     Mental Status: She is alert and oriented to person, place, and time.  Psychiatric:        Mood and Affect: Mood is anxious.        Behavior: Behavior normal. Behavior is not aggressive, hyperactive or combative. Behavior is cooperative.        Thought Content: Thought content is not paranoid. Thought content does not include homicidal or suicidal ideation.        Cognition and Memory: Cognition and memory normal.        Judgment: Judgment normal.     ED Results / Procedures / Treatments   Labs (all labs ordered are listed, but only abnormal results are displayed) Labs Reviewed - No data to display  EKG None  Radiology No results found.  Procedures Procedures (including critical care time)  Medications Ordered in ED Medications - No data to display  ED Course  I have reviewed the triage vital signs and the nursing notes.  Pertinent labs & imaging results that were available during my care of the patient were reviewed by me and considered in my medical decision making (see chart for details).  Clinical Course as of Sep 08 1829  Wed Sep 08, 7161  5731 44 year old female presents as above. Discussed needs at this time, agreeable with refill of Vistaril. Declines any prescription sleep aid, discussed Melatonin if needed. Plan is  to go to her AA meeting tonight, call crisis line if needed, arrange follow up with counselor in the morning. Has good support with family and friends.    [LM]    Clinical Course User Index [LM] Percell Miller,  Sharen Hint   MDM Rules/Calculators/A&P                          Final Clinical Impression(s) / ED Diagnoses Final diagnoses:  Anxiety state    Rx / DC Orders ED Discharge Orders         Ordered    hydrOXYzine (ATARAX/VISTARIL) 25 MG tablet  Daily PRN        09/08/20 1821           Tacy Learn, PA-C 09/08/20 1830    Malvin Johns, MD 09/08/20 2235

## 2021-01-09 ENCOUNTER — Other Ambulatory Visit: Payer: Self-pay | Admitting: Medical-Surgical

## 2021-01-09 DIAGNOSIS — F3175 Bipolar disorder, in partial remission, most recent episode depressed: Secondary | ICD-10-CM

## 2021-02-19 ENCOUNTER — Other Ambulatory Visit: Payer: Self-pay | Admitting: Medical-Surgical

## 2021-02-19 DIAGNOSIS — F3175 Bipolar disorder, in partial remission, most recent episode depressed: Secondary | ICD-10-CM

## 2021-03-10 ENCOUNTER — Telehealth (INDEPENDENT_AMBULATORY_CARE_PROVIDER_SITE_OTHER): Payer: Commercial Managed Care - PPO | Admitting: Medical-Surgical

## 2021-03-10 ENCOUNTER — Encounter: Payer: Self-pay | Admitting: Medical-Surgical

## 2021-03-10 DIAGNOSIS — F3175 Bipolar disorder, in partial remission, most recent episode depressed: Secondary | ICD-10-CM

## 2021-03-10 DIAGNOSIS — Z8261 Family history of arthritis: Secondary | ICD-10-CM | POA: Diagnosis not present

## 2021-03-10 HISTORY — DX: Family history of arthritis: Z82.61

## 2021-03-10 MED ORDER — LURASIDONE HCL 80 MG PO TABS: 80.0000 mg | ORAL_TABLET | Freq: Every day | ORAL | 5 refills | Status: DC

## 2021-03-10 MED ORDER — ESCITALOPRAM OXALATE 20 MG PO TABS: 20.0000 mg | ORAL_TABLET | Freq: Every day | ORAL | 5 refills | Status: DC

## 2021-03-10 NOTE — Progress Notes (Signed)
Virtual Visit via Video Note  I connected with Chelsea Wells on 03/10/21 at  9:30 AM EDT by a video enabled telemedicine application and verified that I am speaking with the correct person using two identifiers.   I discussed the limitations of evaluation and management by telemedicine and the availability of in person appointments. The patient expressed understanding and agreed to proceed.  Patient location: home Provider locations: office  Subjective:    CC: Mood follow up  HPI: Pleasant 45 year old female presenting today for med follow-up.  She is currently taking escitalopram 20 mg daily and Latuda 80 mg daily.  Patient believes medications at night and tolerates them well without side effects.  Notes that she is sleeping well overall.  Admits to missing some doses on occasion.  If missing greater than 2 doses, she is able to tell a difference so she knows the medication is working.  She has not been using hydroxyzine.  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  Because of this change, she has put school on hold.  Her sister was recently diagnosed with RA and she thinks that her grandmother may have attitude.  She currently has no significant symptoms aside from knee pain with walking at times.  She notes that she did play a lot of sports in her earlier years and was a Geneticist, molecular for several years.  She attributes her knee pain and general body aches into that.  In the winter, she notes that when she works with her hands they do tend to hurt but she has noticed no joint swelling, redness, or ulnar deviation.  No issues in her feet or hips.  Takes Tylenol arthritis as needed which is very helpful for her symptoms.  Past medical history, Surgical history, Family history not pertinant except as noted below, Social history, Allergies, and medications have been entered into the medical record, reviewed, and corrections made.   Review of Systems: See  HPI for pertinent positives and negatives.   Objective:    General: Speaking clearly in complete sentences without any shortness of breath.  Alert and oriented x3.  Normal judgment. No apparent acute distress.  Impression and Recommendations:    1. Bipolar 1 disorder, depressed, partial remission ([place]) Doing very well on current regimen. Continue Lexapro and Latuda. Patient interested in a generic for Latuda due to cost. There has been one approved but unsure if this is available yet. Refill sent with request to fill as generic if available. Advised patient to monitor for less efficiency in the generic form and if this occurs, to contact me so I can resend the medication for brand name. Patient verbalized understanding and is agreeable to the plan.  - escitalopram (LEXAPRO) 20 MG tablet; Take 1 tablet (20 mg total) by mouth daily.  Dispense: 30 tablet; Refill: 5 - lurasidone (LATUDA) 80 MG TABS tablet; Take 1 tablet (80 mg total) by mouth daily with breakfast.  Dispense: 30 tablet; Refill: 5  2. Family history of rheumatoid arthritis Diagnosis added to chart for future consideration. No current symptoms of concern and patient does not feel that workup is warranted. She will let us know if symptoms develop so we can investigate further. Reviewed typical presentation of RA so she will be aware of what to watch for.   I discussed the assessment and treatment plan with the patient. The patient was provided an opportunity to ask questions and all were answered.  The patient agreed with the plan and demonstrated an understanding of the instructions.   The patient was advised to call back or seek an in-person evaluation if the symptoms worsen or if the condition fails to improve as anticipated.  20 minutes of non-face-to-face time was provided during this encounter.  Return in about 6 months (around 09/10/2021) for mood follow up.  Clearnce Sorrel, DNP, APRN, FNP-BC [facility]

## 2021-07-25 ENCOUNTER — Ambulatory Visit (INDEPENDENT_AMBULATORY_CARE_PROVIDER_SITE_OTHER): Payer: BC Managed Care – PPO | Admitting: Medical-Surgical

## 2021-07-25 ENCOUNTER — Encounter: Payer: Self-pay | Admitting: Medical-Surgical

## 2021-07-25 ENCOUNTER — Other Ambulatory Visit: Payer: Self-pay

## 2021-07-25 VITALS — BP 107/75 | HR 68 | Resp 20 | Ht 66.0 in | Wt 191.2 lb

## 2021-07-25 DIAGNOSIS — Z0000 Encounter for general adult medical examination without abnormal findings: Secondary | ICD-10-CM | POA: Diagnosis not present

## 2021-07-25 DIAGNOSIS — Z1231 Encounter for screening mammogram for malignant neoplasm of breast: Secondary | ICD-10-CM | POA: Diagnosis not present

## 2021-07-25 NOTE — Progress Notes (Signed)
HPI: Chelsea Wells is a 45 y.o. female who  has a past medical history of Alcoholism ([place]), Allergy, Anxiety, Arthritis, Cardiac murmur, Cat allergies, Depressed, Drug abuse ([place]), OCD (obsessive compulsive disorder), and POTS (postural orthostatic tachycardia syndrome).  she presents to [facility] today, 07/25/21,  for chief complaint of: Annual physical exam  Dentist: no current care, no concerns Eye exam: UTD, wears glasses Exercise: None intentional, stand up desk now Diet: well balanced, no special diets Pap smear: never had one, declined today Mammogram: never had one, okay with ordering COVID vaccine: vaccinated and boosted  Concerns: Weaning off Latuda due to medication cost of $1k a month.   Past medical, surgical, social and family history reviewed:  Patient Active Problem List   Diagnosis Date Noted   Family history of rheumatoid arthritis 03/10/2021   Migraine with aura and without status migrainosus, not intractable 04/29/2020   Family history of colon cancer in mother 11/04/2018   Bipolar 1 disorder, depressed, partial remission ([place]) 11/04/2018   Shellfish allergy 11/04/2018   Moderate alcohol use disorder, in sustained remission ([place]) 11/04/2018   Chewing tobacco nicotine dependence without complication 54/00/8676   Lightheadedness 08/10/2011   Syncope 07/23/2011   CARDIAC MURMUR 08/18/2009    Past Surgical History:  Procedure Laterality Date   TONSILLECTOMY      Social History   Tobacco Use   Smoking status: Former    Packs/day: 1.00    Years: 4.00    Pack years: 4.00    Types: Cigarettes    Quit date: 07/24/2021   Smokeless tobacco: Current    Types: Chew   Tobacco comments:    quit 10/06  Substance Use Topics   Alcohol use: Not Currently    Comment: sober since 2018    Family History  Problem Relation Age of Onset   Cancer Mother        skin   Colon cancer Mother    Breast cancer Paternal  Grandmother    Esophageal cancer Neg Hx      Current medication list and allergy/intolerance information reviewed:    Current Outpatient Medications  Medication Sig Dispense Refill   escitalopram (LEXAPRO) 20 MG tablet Take 1 tablet (20 mg total) by mouth daily. 30 tablet 5   No current facility-administered medications for this visit.    Allergies  Allergen Reactions   Shellfish Allergy Anaphylaxis   Amoxicillin Rash    Blisters in mouth   Azithromycin Rash   Naproxen Rash    Blister in mouth      Review of Systems: Constitutional:  No  fever, no chills, No recent illness, No unintentional weight changes. No significant fatigue.  HEENT: No  headache, no vision change, no hearing change, No sore throat, No  sinus pressure Cardiac: No  chest pain, No  pressure, No palpitations, No  Orthopnea Respiratory:  No  shortness of breath. No  Cough Gastrointestinal: No  abdominal pain, No  nausea, No  vomiting,  No  blood in stool, No  diarrhea, No  constipation  Musculoskeletal: No new myalgia/arthralgia Skin: No  Rash, No other wounds/concerning lesions Genitourinary: No  incontinence, No  abnormal genital bleeding, No abnormal genital discharge Hem/Onc: No  easy bruising/bleeding, No  abnormal lymph node Endocrine: No cold intolerance,  No heat intolerance. No polyuria/polydipsia/polyphagia  Neurologic: No  weakness, No  dizziness, No  slurred speech/focal weakness/facial droop Psychiatric: No  concerns with depression, No  concerns with anxiety, No sleep problems,  No mood problems  Exam:  BP 107/75 (BP Location: Left Arm, Patient Position: Sitting, Cuff Size: Large)   Pulse 68   Resp 20   Ht '5\' 6"'  (1.676 m)   Wt 191 lb 3.2 oz (86.7 kg)   SpO2 99%   BMI 30.86 kg/m  Constitutional: VS see above. General Appearance: alert, well-developed, well-nourished, NAD Eyes: Normal lids and conjunctive, non-icteric sclera Ears, Nose, Mouth, Throat: MMM, Normal external inspection  ears/nares/mouth/lips/gums. TM normal bilaterally. Neck: No masses, trachea midline. No thyroid enlargement. No tenderness/mass appreciated. No lymphadenopathy Respiratory: Normal respiratory effort. no wheeze, no rhonchi, no rales Cardiovascular: S1/S2 normal, no murmur, no rub/gallop auscultated. RRR. No lower extremity edema.  No carotid bruit or JVD. No abdominal aortic bruit. Gastrointestinal: Nontender, no masses. No hepatomegaly, no splenomegaly. No hernia appreciated. Bowel sounds normal. Rectal exam deferred.  Musculoskeletal: Gait normal. No clubbing/cyanosis of digits.  Neurological: Normal balance/coordination. No tremor. No cranial nerve deficit on limited exam. Motor and sensation intact and symmetric. Cerebellar reflexes intact.  Skin: warm, dry, intact. No rash/ulcer. No concerning nevi or subq nodules on limited exam.   Psychiatric: Normal judgment/insight. Normal mood and affect. Oriented x3.    ASSESSMENT/PLAN:   1. Annual physical exam Checking CBC with differential, CMP, and lipid panel today. - CBC with Differential/Platelet - COMPLETE METABOLIC PANEL WITH GFR - Lipid panel  2. Encounter for screening mammogram for malignant neoplasm of breast Mammogram ordered. - MM 3D SCREEN BREAST BILATERAL; Future - CBC with Differential/Platelet   Orders Placed This Encounter  Procedures   MM 3D SCREEN BREAST BILATERAL   CBC with Differential/Platelet   COMPLETE METABOLIC PANEL WITH GFR   Lipid panel    No orders of the defined types were placed in this encounter.   Patient Instructions  Preventive Care 51-7 Years Old, Female Preventive care refers to lifestyle choices and visits with your health care provider that can promote health and wellness. This includes: A yearly physical exam. This is also called an annual wellness visit. Regular dental and eye exams. Immunizations. Screening for certain conditions. Healthy lifestyle choices, such as: Eating a healthy  diet. Getting regular exercise. Not using drugs or products that contain nicotine and tobacco. Limiting alcohol use. What can I expect for my preventive care visit? Physical exam Your health care provider will check your: Height and weight. These may be used to calculate your BMI (body mass index). BMI is a measurement that tells if you are at a healthy weight. Heart rate and blood pressure. Body temperature. Skin for abnormal spots. Counseling Your health care provider may ask you questions about your: Past medical problems. Family's medical history. Alcohol, tobacco, and drug use. Emotional well-being. Home life and relationship well-being. Sexual activity. Diet, exercise, and sleep habits. Work and work Statistician. Access to firearms. Method of birth control. Menstrual cycle. Pregnancy history. What immunizations do I need?  Vaccines are usually given at various ages, according to a schedule. Your health care provider will recommend vaccines for you based on your age, medicalhistory, and lifestyle or other factors, such as travel or where you work. What tests do I need? Blood tests Lipid and cholesterol levels. These may be checked every 5 years, or more often if you are over 38 years old. Hepatitis C test. Hepatitis B test. Screening Lung cancer screening. You may have this screening every year starting at age 76 if you have a 30-pack-year history of smoking and currently smoke or have quit within the past 15  years. Colorectal cancer screening. All adults should have this screening starting at age 77 and continuing until age 35. Your health care provider may recommend screening at age 105 if you are at increased risk. You will have tests every 1-10 years, depending on your results and the type of screening test. Diabetes screening. This is done by checking your blood sugar (glucose) after you have not eaten for a while (fasting). You may have this done every 1-3  years. Mammogram. This may be done every 1-2 years. Talk with your health care provider about when you should start having regular mammograms. This may depend on whether you have a family history of breast cancer. BRCA-related cancer screening. This may be done if you have a family history of breast, ovarian, tubal, or peritoneal cancers. Pelvic exam and Pap test. This may be done every 3 years starting at age 59. Starting at age 64, this may be done every 5 years if you have a Pap test in combination with an HPV test. Other tests STD (sexually transmitted disease) testing, if you are at risk. Bone density scan. This is done to screen for osteoporosis. You may have this scan if you are at high risk for osteoporosis. Talk with your health care provider about your test results, treatment options,and if necessary, the need for more tests. Follow these instructions at home: Eating and drinking  Eat a diet that includes fresh fruits and vegetables, whole grains, lean protein, and low-fat dairy products. Take vitamin and mineral supplements as recommended by your health care provider. Do not drink alcohol if: Your health care provider tells you not to drink. You are pregnant, may be pregnant, or are planning to become pregnant. If you drink alcohol: Limit how much you have to 0-1 drink a day. Be aware of how much alcohol is in your drink. In the [place] one drink equals one 12 oz bottle of beer (355 mL), one 5 oz glass of wine (148 mL), or one 1 oz glass of hard liquor (44 mL).  Lifestyle Take daily care of your teeth and gums. Brush your teeth every morning and night with fluoride toothpaste. Floss one time each day. Stay active. Exercise for at least 30 minutes 5 or more days each week. Do not use any products that contain nicotine or tobacco, such as cigarettes, e-cigarettes, and chewing tobacco. If you need help quitting, ask your health care provider. Do not use drugs. If you are sexually  active, practice safe sex. Use a condom or other form of protection to prevent STIs (sexually transmitted infections). If you do not wish to become pregnant, use a form of birth control. If you plan to become pregnant, see your health care provider for a prepregnancy visit. If told by your health care provider, take low-dose aspirin daily starting at age 55. Find healthy ways to cope with stress, such as: Meditation, yoga, or listening to music. Journaling. Talking to a trusted person. Spending time with friends and family. Safety Always wear your seat belt while driving or riding in a vehicle. Do not drive: If you have been drinking alcohol. Do not ride with someone who has been drinking. When you are tired or distracted. While texting. Wear a helmet and other protective equipment during sports activities. If you have firearms in your house, make sure you follow all gun safety procedures. What's next? Visit your health care provider once a year for an annual wellness visit. Ask your health care provider how often you  should have your eyes and teeth checked. Stay up to date on all vaccines. This information is not intended to replace advice given to you by your health care provider. Make sure you discuss any questions you have with your healthcare provider. Document Revised: 09/07/2020 Document Reviewed: 08/15/2018 Elsevier Patient Education  2022 [employer].  Follow-up plan: Return in about 1 year (around 07/25/2022) for annual physical exam.  Clearnce Sorrel, DNP, APRN, FNP-BC [facility]

## 2021-07-25 NOTE — Patient Instructions (Signed)
Preventive Care 68-43 Years Old, Female Preventive care refers to lifestyle choices and visits with your health care provider that can promote health and wellness. This includes: A yearly physical exam. This is also called an annual wellness visit. Regular dental and eye exams. Immunizations. Screening for certain conditions. Healthy lifestyle choices, such as: Eating a healthy diet. Getting regular exercise. Not using drugs or products that contain nicotine and tobacco. Limiting alcohol use. What can I expect for my preventive care visit? Physical exam Your health care provider will check your: Height and weight. These may be used to calculate your BMI (body mass index). BMI is a measurement that tells if you are at a healthy weight. Heart rate and blood pressure. Body temperature. Skin for abnormal spots. Counseling Your health care provider may ask you questions about your: Past medical problems. Family's medical history. Alcohol, tobacco, and drug use. Emotional well-being. Home life and relationship well-being. Sexual activity. Diet, exercise, and sleep habits. Work and work Statistician. Access to firearms. Method of birth control. Menstrual cycle. Pregnancy history. What immunizations do I need?  Vaccines are usually given at various ages, according to a schedule. Your health care provider will recommend vaccines for you based on your age, medicalhistory, and lifestyle or other factors, such as travel or where you work. What tests do I need? Blood tests Lipid and cholesterol levels. These may be checked every 5 years, or more often if you are over 43 years old. Hepatitis C test. Hepatitis B test. Screening Lung cancer screening. You may have this screening every year starting at age 30 if you have a 30-pack-year history of smoking and currently smoke or have quit within the past 15 years. Colorectal cancer screening. All adults should have this screening starting at  age 23 and continuing until age 3. Your health care provider may recommend screening at age 43 if you are at increased risk. You will have tests every 1-10 years, depending on your results and the type of screening test. Diabetes screening. This is done by checking your blood sugar (glucose) after you have not eaten for a while (fasting). You may have this done every 1-3 years. Mammogram. This may be done every 1-2 years. Talk with your health care provider about when you should start having regular mammograms. This may depend on whether you have a family history of breast cancer. BRCA-related cancer screening. This may be done if you have a family history of breast, ovarian, tubal, or peritoneal cancers. Pelvic exam and Pap test. This may be done every 3 years starting at age 43. Starting at age 43, this may be done every 5 years if you have a Pap test in combination with an HPV test. Other tests STD (sexually transmitted disease) testing, if you are at risk. Bone density scan. This is done to screen for osteoporosis. You may have this scan if you are at high risk for osteoporosis. Talk with your health care provider about your test results, treatment options,and if necessary, the need for more tests. Follow these instructions at home: Eating and drinking  Eat a diet that includes fresh fruits and vegetables, whole grains, lean protein, and low-fat dairy products. Take vitamin and mineral supplements as recommended by your health care provider. Do not drink alcohol if: Your health care provider tells you not to drink. You are pregnant, may be pregnant, or are planning to become pregnant. If you drink alcohol: Limit how much you have to 0-1 drink a day. Be aware  of how much alcohol is in your drink. In the [place] one drink equals one 12 oz bottle of beer (355 mL), one 5 oz glass of wine (148 mL), or one 1 oz glass of hard liquor (44 mL).  Lifestyle Take daily care of your teeth and  gums. Brush your teeth every morning and night with fluoride toothpaste. Floss one time each day. Stay active. Exercise for at least 30 minutes 5 or more days each week. Do not use any products that contain nicotine or tobacco, such as cigarettes, e-cigarettes, and chewing tobacco. If you need help quitting, ask your health care provider. Do not use drugs. If you are sexually active, practice safe sex. Use a condom or other form of protection to prevent STIs (sexually transmitted infections). If you do not wish to become pregnant, use a form of birth control. If you plan to become pregnant, see your health care provider for a prepregnancy visit. If told by your health care provider, take low-dose aspirin daily starting at age 43. Find healthy ways to cope with stress, such as: Meditation, yoga, or listening to music. Journaling. Talking to a trusted person. Spending time with friends and family. Safety Always wear your seat belt while driving or riding in a vehicle. Do not drive: If you have been drinking alcohol. Do not ride with someone who has been drinking. When you are tired or distracted. While texting. Wear a helmet and other protective equipment during sports activities. If you have firearms in your house, make sure you follow all gun safety procedures. What's next? Visit your health care provider once a year for an annual wellness visit. Ask your health care provider how often you should have your eyes and teeth checked. Stay up to date on all vaccines. This information is not intended to replace advice given to you by your health care provider. Make sure you discuss any questions you have with your healthcare provider. Document Revised: 09/07/2020 Document Reviewed: 08/15/2018 Elsevier Patient Education  2022 [employer].

## 2021-08-01 ENCOUNTER — Other Ambulatory Visit: Payer: BC Managed Care – PPO

## 2021-08-18 ENCOUNTER — Ambulatory Visit: Payer: BC Managed Care – PPO

## 2021-10-24 ENCOUNTER — Other Ambulatory Visit: Payer: Self-pay | Admitting: Medical-Surgical

## 2021-10-24 DIAGNOSIS — F3175 Bipolar disorder, in partial remission, most recent episode depressed: Secondary | ICD-10-CM

## 2021-11-23 ENCOUNTER — Other Ambulatory Visit: Payer: Self-pay | Admitting: Medical-Surgical

## 2021-11-23 DIAGNOSIS — F3175 Bipolar disorder, in partial remission, most recent episode depressed: Secondary | ICD-10-CM

## 2022-02-20 ENCOUNTER — Other Ambulatory Visit: Payer: Self-pay | Admitting: Medical-Surgical

## 2022-02-20 DIAGNOSIS — F3175 Bipolar disorder, in partial remission, most recent episode depressed: Secondary | ICD-10-CM

## 2022-02-27 ENCOUNTER — Other Ambulatory Visit: Payer: Self-pay

## 2022-02-27 ENCOUNTER — Ambulatory Visit
Admission: EM | Admit: 2022-02-27 | Discharge: 2022-02-27 | Disposition: A | Discharge status: Other Acute Inpatient Hospital | Payer: BC Managed Care – PPO

## 2022-05-21 ENCOUNTER — Other Ambulatory Visit: Payer: Self-pay | Admitting: Medical-Surgical

## 2022-05-21 DIAGNOSIS — F3175 Bipolar disorder, in partial remission, most recent episode depressed: Secondary | ICD-10-CM

## 2022-07-18 NOTE — Progress Notes (Unsigned)
Virtual Visit via Video Note  I connected with Chelsea Wells on 07/20/22 at 11:10 AM EDT by a video enabled telemedicine application and verified that I am speaking with the correct person using two identifiers.   I discussed the limitations of evaluation and management by telemedicine and the availability of in person appointments. The patient expressed understanding and agreed to proceed.  Patient location: home Provider locations: office  Subjective:    CC: Weight gain, smoking cessation  HPI: Pleasant 46 year old female presenting via MyChart video visit for the following:  Weight gain: Reports approximately 40 pound weight gain in the last year.  Notes that several things have changed [place]  Now she has access to more bad food choices and fast food.  She has not been watching her diet or exercising at all.  She also stopped [place] and wonders if this may have some role in her weight gain.  Also notes that she is having menopausal changes and feels that that may be contributing to her weight gain.    Smoking cessation: Notes that she just celebrated 5 years sober and has decided that her goal for this year is to stop smoking.  She smokes approximately 1 pack of cigarettes per day but also chews tobacco regularly.  She has been trying to take steps to quit over the last 3 months but has been unsuccessful.  She did try nicotine gum but this was not helpful.  She is looking at adjusting her AA 12-step program to work for alcohol but also tobacco cessation.  She feels that she is "done" with using tobacco and is very ready to quit.  She reports that her cravings are both physical as well as psychological.  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  Past medical history, Surgical history, Family history not pertinant except as noted below, Social history,  Allergies, and medications have been entered into the medical record, reviewed, and corrections made.   Review of Systems: See HPI for pertinent positives and negatives.   Objective:    General: Speaking clearly in complete sentences without any shortness of breath.  Alert and oriented x3.  Normal judgment. No apparent acute distress.  Impression and Recommendations:    1. Tobacco abuse counseling Discussed various options for tobacco cessation support.  With her history of mental health issues, we will need to proceed carefully.  Discussed Chantix versus Wellbutrin.  She would like to proceed with Wellbutrin so sending 150 mg XL daily to her pharmacy.  Advised her to monitor very closely for any increased anxiety or side effects from the medicine.  We will start slowly and avoid increasing her dose for at least 2 weeks to see how she is doing.  Reviewed options for nicotine gum, lozenges, and patches.  Since she is a heavy smoker and using chewing tobacco, I would like her to try the 21 mg patch daily for the next 4 weeks followed by a slow titration to 14 mg daily and then 7 mg daily before stopping.  2. Weight gain Unfortunately, she is in a perfect storm of contributing factors.  Some of her weight gain may be hormonal but I feel a lot of it is related to poor dietary choices and significant reduction in physical activity.  Recommend lifestyle modifications and dietary changes as a first-line treatment.  We are sending in  Wellbutrin which may help with some of her unhealthy cravings.  I discussed the assessment and treatment plan with the patient. The patient was provided an opportunity to ask questions and all were answered. The patient agreed with the plan and demonstrated an understanding of the instructions.   The patient was advised to call back or seek an in-person evaluation if the symptoms worsen or if the condition fails to improve as anticipated.  25 minutes of non-face-to-face time  was provided during this encounter.  Return in about 4 weeks (around 08/17/2022) for Tobacco cessation follow-up.  Clearnce Sorrel, DNP, APRN, FNP-BC [facility]

## 2022-07-20 ENCOUNTER — Encounter: Payer: Self-pay | Admitting: Medical-Surgical

## 2022-07-20 ENCOUNTER — Telehealth (INDEPENDENT_AMBULATORY_CARE_PROVIDER_SITE_OTHER): Payer: BC Managed Care – PPO | Admitting: Medical-Surgical

## 2022-07-20 DIAGNOSIS — R635 Abnormal weight gain: Secondary | ICD-10-CM | POA: Diagnosis not present

## 2022-07-20 DIAGNOSIS — Z716 Tobacco abuse counseling: Secondary | ICD-10-CM | POA: Diagnosis not present

## 2022-07-20 MED ORDER — BUPROPION HCL ER (XL) 150 MG PO TB24: 150.0000 mg | ORAL_TABLET | Freq: Every day | ORAL | 1 refills | Status: DC

## 2022-08-24 ENCOUNTER — Ambulatory Visit
Admission: RE | Admit: 2022-08-24 | Discharge: 2022-08-24 | Discharge status: Against Medical Advice | Payer: BC Managed Care – PPO | Source: Ambulatory Visit | Attending: Medical-Surgical | Admitting: Medical-Surgical

## 2022-09-20 ENCOUNTER — Other Ambulatory Visit: Payer: Self-pay

## 2022-09-20 DIAGNOSIS — F3175 Bipolar disorder, in partial remission, most recent episode depressed: Secondary | ICD-10-CM

## 2022-09-20 MED ORDER — ESCITALOPRAM OXALATE 20 MG PO TABS: 20.0000 mg | ORAL_TABLET | Freq: Every day | ORAL | 1 refills | Status: DC

## 2023-04-05 ENCOUNTER — Other Ambulatory Visit: Payer: Self-pay | Admitting: Medical-Surgical

## 2023-04-05 DIAGNOSIS — F3175 Bipolar disorder, in partial remission, most recent episode depressed: Secondary | ICD-10-CM

## 2023-04-26 ENCOUNTER — Telehealth: Payer: BC Managed Care – PPO | Admitting: Physician Assistant

## 2023-04-26 ENCOUNTER — Telehealth: Payer: Self-pay | Admitting: Medical-Surgical

## 2023-04-26 DIAGNOSIS — F419 Anxiety disorder, unspecified: Secondary | ICD-10-CM | POA: Insufficient documentation

## 2023-04-26 DIAGNOSIS — Z760 Encounter for issue of repeat prescription: Secondary | ICD-10-CM | POA: Insufficient documentation

## 2023-04-26 DIAGNOSIS — F319 Bipolar disorder, unspecified: Secondary | ICD-10-CM | POA: Insufficient documentation

## 2023-04-26 MED ORDER — ESCITALOPRAM OXALATE 20 MG PO TABS: 20.0000 mg | ORAL_TABLET | Freq: Every day | ORAL | 0 refills | Status: DC

## 2023-04-26 NOTE — Patient Instructions (Signed)
  Chelsea Wells, thank you for joining Piedad Climes, PA-C for today's virtual visit.  While this provider is not your primary care provider (PCP), if your PCP is located in our provider database this encounter information will be shared with them immediately following your visit.   A [facility] MyChart account gives you access to today's visit and all your visits, tests, and labs performed at [facility] " click here if you don't have a [facility] MyChart account or go to [email or web address]  Consent: (Patient) Chelsea Wells provided verbal consent for this virtual visit at the beginning of the encounter.  Current Medications:  Current Outpatient Medications:    escitalopram (LEXAPRO) 20 MG tablet, Take 1 tablet (20 mg total) by mouth daily., Disp: 90 tablet, Rfl: 1   Medications ordered in this encounter:  No orders of the defined types were placed in this encounter.    *If you need refills on other medications prior to your next appointment, please contact your pharmacy*  Follow-Up: Call back or seek an in-person evaluation if the symptoms worsen or if the condition fails to improve as anticipated.  [facility] [phone]  Other Instructions Go to [email or web address].  When registering/sign in, be sure to select that you are currently in [place], so I can connect you with the provider licensed in that area   If you have been instructed to have an in-person evaluation today at a local Urgent Care facility, please use the link below. It will take you to a list of all of our available [facility], including address, phone number and hours of operation. Please do not delay care.  [facility]  If you or a family member do not have a primary care provider, use the link below to schedule a visit and establish care. When you choose a [facility] primary care physician or advanced practice provider, you  gain a long-term partner in health. Find a Primary Care Provider  Learn more about [facility]'s in-office and virtual care options: [place] - Get Care Now

## 2023-04-26 NOTE — Progress Notes (Signed)
Patient trying to get a temporary refill for her Lexapro.  Is currently out of state in [place].  Did reach out to her PCP office but PCP is not in the office the rest of the week.  She does not want to be without her medication.  Given that she is out of [place] where we are licensed, triage patient to an [place] visit, so she can be taken care of ASAP.

## 2023-04-26 NOTE — Telephone Encounter (Signed)
Emergency supply of 2 tablets sent to the pharmacy as requested. She is due for an in office follow up and needs to schedule an appointment for follow up to prevent delays in refills. Has not been seen in office since 07/2021.   ___________________________________________ Thayer Ohm, DNP, APRN, FNP-BC [facility] [place]

## 2023-04-26 NOTE — Telephone Encounter (Signed)
Patient called requesting 2 pills of her escitalopram (LEXAPRO) 20 MG tablet [[id number]] medication.   Patient is currently out of town and forgot her medication.  Pharmacy - [facility] [address]  [place], [place] [address]

## 2023-04-26 NOTE — Telephone Encounter (Signed)
Unable to leave a message, voicemail is full.  

## 2023-05-03 ENCOUNTER — Encounter: Payer: Self-pay | Admitting: Medical-Surgical

## 2023-05-03 ENCOUNTER — Ambulatory Visit (INDEPENDENT_AMBULATORY_CARE_PROVIDER_SITE_OTHER): Payer: BC Managed Care – PPO | Admitting: Medical-Surgical

## 2023-05-03 VITALS — BP 110/70 | HR 76 | Resp 20 | Ht 66.0 in | Wt 208.8 lb

## 2023-05-03 DIAGNOSIS — E669 Obesity, unspecified: Secondary | ICD-10-CM

## 2023-05-03 DIAGNOSIS — Z1329 Encounter for screening for other suspected endocrine disorder: Secondary | ICD-10-CM

## 2023-05-03 DIAGNOSIS — Z0000 Encounter for general adult medical examination without abnormal findings: Secondary | ICD-10-CM | POA: Diagnosis not present

## 2023-05-03 DIAGNOSIS — Z131 Encounter for screening for diabetes mellitus: Secondary | ICD-10-CM

## 2023-05-03 DIAGNOSIS — F3175 Bipolar disorder, in partial remission, most recent episode depressed: Secondary | ICD-10-CM

## 2023-05-03 LAB — CBC WITH DIFFERENTIAL/PLATELET
Basophils Absolute: 53 cells/uL (ref 0–200)
Hemoglobin: 12.2 g/dL (ref 11.7–15.5)
Monocytes Relative: 7.2 %
Neutrophils Relative %: 61.6 %
Platelets: 319 10*3/uL (ref 140–400)
RDW: 12.5 % (ref 11.0–15.0)

## 2023-05-03 MED ORDER — ESCITALOPRAM OXALATE 20 MG PO TABS: 20.0000 mg | ORAL_TABLET | Freq: Every day | ORAL | 3 refills | Status: DC

## 2023-05-03 NOTE — Progress Notes (Signed)
Complete physical exam  Patient: Chelsea Wells   DOB: [birth year]   47 y.o. Female  MRN: [id number]  Subjective:    Chief Complaint  Patient presents with   Annual Exam   Chelsea Wells is a 47 y.o. female who presents today for a complete physical exam. She reports consuming a general diet. The patient does not participate in regular exercise at present. She generally feels well. She reports sleeping well. She does not have additional problems to discuss today.   Most recent fall risk assessment:    05/03/2023    2:14 PM  Fall Risk   Falls in the past year? 0  Number falls in past yr: 0  Injury with Fall? 0  Risk for fall due to : History of fall(s)  Follow up Falls evaluation completed     Most recent depression screenings:    05/03/2023    2:14 PM 07/25/2021    1:58 PM  PHQ 2/9 Scores  PHQ - 2 Score 0 0    Vision:Within last year, Dental: Current dental problems and No regular dental care , and STD: The patient denies history of sexually transmitted disease.    Patient Care Team: Christen Butter, NP as PCP - General (Nurse Practitioner) Dava Najjar., MD as Referring Physician (Internal Medicine)   Outpatient Medications Prior to Visit  Medication Sig   [DISCONTINUED] escitalopram (LEXAPRO) 20 MG tablet Take 1 tablet (20 mg total) by mouth daily.   [DISCONTINUED] escitalopram (LEXAPRO) 20 MG tablet Take 1 tablet (20 mg total) by mouth daily.   No facility-administered medications prior to visit.    Review of Systems  Constitutional:  Negative for chills, fever, malaise/fatigue and weight loss.       Hot flashes, night sweats  HENT:  Negative for congestion, ear pain, hearing loss, sinus pain and sore throat.   Eyes:  Negative for blurred vision, photophobia and pain.  Respiratory:  Negative for cough, shortness of breath and wheezing.   Cardiovascular:  Negative for chest pain, palpitations and leg swelling.  Gastrointestinal:  Negative for abdominal  pain, constipation, diarrhea, heartburn, nausea and vomiting.  Genitourinary:  Negative for dysuria, frequency and urgency.  Musculoskeletal:  Negative for falls and neck pain.  Skin:  Negative for itching and rash.  Neurological:  Negative for dizziness, weakness and headaches.  Endo/Heme/Allergies:  Negative for polydipsia. Does not bruise/bleed easily.  Psychiatric/Behavioral:  Negative for depression, substance abuse and suicidal ideas. The patient is not nervous/anxious and does not have insomnia.      Objective:     BP 110/70 (BP Location: Right Arm, Cuff Size: Normal)   Pulse 76   Resp 20   Ht 5\' 6"  (1.676 m)   Wt 208 lb 12.8 oz (94.7 kg)   SpO2 98%   BMI 33.70 kg/m    Physical Exam Constitutional:      General: She is not in acute distress.    Appearance: Normal appearance. She is obese. She is not ill-appearing.  HENT:     Head: Normocephalic and atraumatic.     Right Ear: Tympanic membrane, ear canal and external ear normal. There is no impacted cerumen.     Left Ear: Tympanic membrane, ear canal and external ear normal. There is no impacted cerumen.     Nose: Nose normal. No congestion or rhinorrhea.     Mouth/Throat:     Mouth: Mucous membranes are moist.     Pharynx: No oropharyngeal exudate or posterior  oropharyngeal erythema.  Eyes:     General: No scleral icterus.       Right eye: No discharge.        Left eye: No discharge.     Extraocular Movements: Extraocular movements intact.     Conjunctiva/sclera: Conjunctivae normal.     Pupils: Pupils are equal, round, and reactive to light.  Neck:     Thyroid: No thyromegaly.     Vascular: No carotid bruit or JVD.     Trachea: Trachea normal.  Cardiovascular:     Rate and Rhythm: Normal rate and regular rhythm.     Pulses: Normal pulses.     Heart sounds: Normal heart sounds. No murmur heard.    No friction rub. No gallop.  Pulmonary:     Effort: Pulmonary effort is normal. No respiratory distress.      Breath sounds: Normal breath sounds. No wheezing.  Abdominal:     General: Bowel sounds are normal. There is no distension.     Palpations: Abdomen is soft.     Tenderness: There is no abdominal tenderness. There is no guarding.  Musculoskeletal:        General: Normal range of motion.     Cervical back: Normal range of motion and neck supple.  Lymphadenopathy:     Cervical: No cervical adenopathy.  Skin:    General: Skin is warm and dry.  Neurological:     Mental Status: She is alert and oriented to person, place, and time.     Cranial Nerves: No cranial nerve deficit.  Psychiatric:        Mood and Affect: Mood normal.        Behavior: Behavior normal.        Thought Content: Thought content normal.        Judgment: Judgment normal.      No results found for any visits on 05/03/23.     Assessment & Plan:    Routine Health Maintenance and Physical Exam  Immunization History  Administered Date(s) Administered   Hepatitis B, PED/ADOLESCENT 05/14/1996, 06/16/1996, 12/08/1996   Janssen (J&J) SARS-COV-2 Vaccination 10/28/2020   Rabies, IM 10/29/2018, 10/31/2018   Tdap 10/29/2018    Health Maintenance  Topic Date Due   Hepatitis C Screening  05/02/2024 (Originally 09/04/1996)   HIV Screening  05/02/2024 (Originally 09/04/1993)   PAP SMEAR-Modifier  04/29/2070 (Originally 09/04/2008)   INFLUENZA VACCINE  07/19/2023   COLONOSCOPY (Pts 45-62yrs Insurance coverage will need to be confirmed)  07/27/2025   DTaP/Tdap/Td (2 - Td or Tdap) 10/29/2028   HPV VACCINES  Aged Out   COVID-19 Vaccine  Discontinued    Discussed health benefits of physical activity, and encouraged her to engage in regular exercise appropriate for her age and condition.  1. Annual physical exam Checking labs as below.  Up-to-date on preventative care.  Wellness information provided with AVS. - Lipid panel - COMPLETE METABOLIC PANEL WITH GFR - CBC with Differential/Platelet  2. Diabetes mellitus  screening Checking hemoglobin A1c. - Hemoglobin A1c  3. Thyroid disorder screen Checking thyroid function. - TSH  4. Obesity (BMI 30-39.9) Labs as below.  Recommend regular intentional exercise and dietary modifications to aid with weight loss. - Hemoglobin A1c - TSH  5. Bipolar 1 disorder, depressed, partial remission (HCC) Stable.  Continue Lexapro 20 mg daily.  Return in about 1 year (around 05/02/2024) for annual physical exam or sooner if needed.   Christen Butter, NP

## 2023-05-03 NOTE — Telephone Encounter (Signed)
Attempted call to patient  . She is in waiting room at time of call.

## 2023-05-04 LAB — COMPLETE METABOLIC PANEL WITH GFR
AG Ratio: 1.7 (calc) (ref 1.0–2.5)
ALT: 14 U/L (ref 6–29)
AST: 13 U/L (ref 10–30)
Albumin: 4.5 g/dL (ref 3.6–5.1)
Alkaline phosphatase (APISO): 51 U/L (ref 31–125)
BUN: 14 mg/dL (ref 7–25)
CO2: 22 mmol/L (ref 20–32)
Calcium: 10 mg/dL (ref 8.6–10.2)
Chloride: 104 mmol/L (ref 98–110)
Creat: 0.53 mg/dL (ref 0.50–0.99)
Globulin: 2.7 g/dL (calc) (ref 1.9–3.7)
Glucose, Bld: 84 mg/dL (ref 65–99)
Potassium: 4.5 mmol/L (ref 3.5–5.3)
Sodium: 139 mmol/L (ref 135–146)
Total Bilirubin: 0.2 mg/dL (ref 0.2–1.2)
Total Protein: 7.2 g/dL (ref 6.1–8.1)
eGFR: 117 mL/min/{1.73_m2} (ref >=60)

## 2023-05-04 LAB — CBC WITH DIFFERENTIAL/PLATELET
Absolute Monocytes: 425 cells/uL (ref 200–950)
Basophils Relative: 0.9 %
Eosinophils Absolute: 100 cells/uL (ref 15–500)
Eosinophils Relative: 1.7 %
HCT: 36.8 % (ref 35.0–45.0)
Lymphs Abs: 1687 cells/uL (ref 850–3900)
MCH: 28.5 pg (ref 27.0–33.0)
MCHC: 33.2 g/dL (ref 32.0–36.0)
MCV: 86 fL (ref 80.0–100.0)
MPV: 12.2 fL (ref 7.5–12.5)
Neutro Abs: 3634 cells/uL (ref 1500–7800)
RBC: 4.28 10*6/uL (ref 3.80–5.10)
Total Lymphocyte: 28.6 %
WBC: 5.9 10*3/uL (ref 3.8–10.8)

## 2023-05-04 LAB — HEMOGLOBIN A1C
Hgb A1c MFr Bld: 5.4 % of total Hgb (ref <5.7)
Mean Plasma Glucose: 108 mg/dL
eAG (mmol/L): 6 mmol/L

## 2023-05-04 LAB — LIPID PANEL
Cholesterol: 249 mg/dL — ABNORMAL HIGH (ref <200)
HDL: 98 mg/dL (ref >=50)
LDL Cholesterol (Calc): 125 mg/dL (calc) — ABNORMAL HIGH
Non-HDL Cholesterol (Calc): 151 mg/dL (calc) — ABNORMAL HIGH (ref <130)
Total CHOL/HDL Ratio: 2.5 (calc) (ref <5.0)
Triglycerides: 151 mg/dL — ABNORMAL HIGH (ref <150)

## 2023-05-04 LAB — TSH: TSH: 1.2 mIU/L

## 2023-08-18 ENCOUNTER — Emergency Department (HOSPITAL_BASED_OUTPATIENT_CLINIC_OR_DEPARTMENT_OTHER)
Admission: EM | Admit: 2023-08-18 | Discharge: 2023-08-18 | Disposition: A | Discharge status: Home / Self Care | Payer: BC Managed Care – PPO | Source: Home / Self Care | Attending: Emergency Medicine | Admitting: Emergency Medicine

## 2023-08-18 ENCOUNTER — Emergency Department (HOSPITAL_BASED_OUTPATIENT_CLINIC_OR_DEPARTMENT_OTHER): Payer: BC Managed Care – PPO

## 2023-08-18 ENCOUNTER — Other Ambulatory Visit: Payer: Self-pay

## 2023-08-18 ENCOUNTER — Encounter (HOSPITAL_BASED_OUTPATIENT_CLINIC_OR_DEPARTMENT_OTHER): Payer: Self-pay

## 2023-08-18 DIAGNOSIS — S63612A Unspecified sprain of right middle finger, initial encounter: Secondary | ICD-10-CM | POA: Diagnosis not present

## 2023-08-18 DIAGNOSIS — S63614A Unspecified sprain of right ring finger, initial encounter: Secondary | ICD-10-CM | POA: Insufficient documentation

## 2023-08-18 DIAGNOSIS — S6991XA Unspecified injury of right wrist, hand and finger(s), initial encounter: Secondary | ICD-10-CM | POA: Diagnosis present
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
## 2023-08-18 NOTE — Discharge Instructions (Signed)
You were seen in the emergency department for swelling and bruising of multiple fingers on your right hand.  Your x-ray did not show any fracture or dislocation.  You can use the finger splint to protect the area and you can also do buddy tape to allow some range of motion.  You should ice the area and use anti-inflammatories if he can tolerate them.  Follow-up with hand surgery if not improved in the next few days.

## 2024-01-05 ENCOUNTER — Encounter (HOSPITAL_BASED_OUTPATIENT_CLINIC_OR_DEPARTMENT_OTHER): Payer: Self-pay | Admitting: Emergency Medicine

## 2024-01-05 ENCOUNTER — Emergency Department (HOSPITAL_BASED_OUTPATIENT_CLINIC_OR_DEPARTMENT_OTHER): Payer: BC Managed Care – PPO

## 2024-01-05 ENCOUNTER — Emergency Department (HOSPITAL_BASED_OUTPATIENT_CLINIC_OR_DEPARTMENT_OTHER)
Admission: EM | Admit: 2024-01-05 | Discharge: 2024-01-05 | Disposition: A | Discharge status: Home / Self Care | Payer: BC Managed Care – PPO | Attending: Emergency Medicine | Admitting: Emergency Medicine

## 2024-01-05 ENCOUNTER — Other Ambulatory Visit: Payer: Self-pay

## 2024-01-05 DIAGNOSIS — R600 Localized edema: Secondary | ICD-10-CM | POA: Diagnosis not present

## 2024-01-05 DIAGNOSIS — R002 Palpitations: Secondary | ICD-10-CM | POA: Diagnosis present

## 2024-01-05 DIAGNOSIS — R0789 Other chest pain: Secondary | ICD-10-CM | POA: Insufficient documentation

## 2024-01-05 LAB — COMPREHENSIVE METABOLIC PANEL
ALT: 29 U/L (ref 0–44)
AST: 20 U/L (ref 15–41)
Albumin: 4.5 g/dL (ref 3.5–5.0)
Alkaline Phosphatase: 53 U/L (ref 38–126)
Anion gap: 10 (ref 5–15)
BUN: 14 mg/dL (ref 6–20)
CO2: 21 mmol/L — ABNORMAL LOW (ref 22–32)
Calcium: 9.4 mg/dL (ref 8.9–10.3)
Chloride: 106 mmol/L (ref 98–111)
Creatinine, Ser: 0.6 mg/dL (ref 0.44–1.00)
GFR, Estimated: >60 mL/min (ref >60)
Glucose, Bld: 87 mg/dL (ref 70–99)
Potassium: 3.9 mmol/L (ref 3.5–5.1)
Sodium: 137 mmol/L (ref 135–145)
Total Bilirubin: 0.4 mg/dL (ref 0.0–1.2)
Total Protein: 7.7 g/dL (ref 6.5–8.1)

## 2024-01-05 LAB — CBC WITH DIFFERENTIAL/PLATELET
Abs Immature Granulocytes: 0.01 10*3/uL (ref 0.00–0.07)
Basophils Absolute: 0.1 10*3/uL (ref 0.0–0.1)
Basophils Relative: 1 %
Eosinophils Absolute: 0.1 10*3/uL (ref 0.0–0.5)
Eosinophils Relative: 2 %
HCT: 37.2 % (ref 36.0–46.0)
Hemoglobin: 12.4 g/dL (ref 12.0–15.0)
Immature Granulocytes: 0 %
Lymphocytes Relative: 26 %
Lymphs Abs: 1.7 10*3/uL (ref 0.7–4.0)
MCH: 27.9 pg (ref 26.0–34.0)
MCHC: 33.3 g/dL (ref 30.0–36.0)
MCV: 83.6 fL (ref 80.0–100.0)
Monocytes Absolute: 0.4 10*3/uL (ref 0.1–1.0)
Monocytes Relative: 6 %
Neutro Abs: 4.2 10*3/uL (ref 1.7–7.7)
Neutrophils Relative %: 65 %
Platelets: 294 10*3/uL (ref 150–400)
RBC: 4.45 MIL/uL (ref 3.87–5.11)
RDW: 13 % (ref 11.5–15.5)
WBC: 6.4 10*3/uL (ref 4.0–10.5)
nRBC: 0 % (ref 0.0–0.2)

## 2024-01-05 LAB — TROPONIN I (HIGH SENSITIVITY)
Troponin I (High Sensitivity): <2 ng/L (ref <18)
Troponin I (High Sensitivity): <2 ng/L (ref <18)

## 2024-01-05 LAB — BRAIN NATRIURETIC PEPTIDE: B Natriuretic Peptide: 15.2 pg/mL (ref 0.0–100.0)

## 2024-01-05 NOTE — ED Triage Notes (Signed)
Pt with palpitations and CP since yesterday (center to LT, describes as sharp); c/o bil ankle swelling; has SHOB with palpitations, but none at this time; NAD

## 2024-01-05 NOTE — ED Provider Notes (Signed)
[place] [facility] [place] Provider Note   CSN: [id number] Arrival date & time: 01/05/24  1141     History  Chief Complaint  Patient presents with   Palpitations    Chelsea Wells is a 48 y.o. female.   Palpitations   48 year old female presents emergency department with complaints of palpitations, left-sided chest discomfort.  Patient states that she has been having palpitations since yesterday.  States that she has a history of palpitations and states that they usually are fleeting.  Denies any known exacerbating relieving factors.  States that she has been trying to cut back on her caffeine consumption which was helping prior to symptom onset yesterday.  Was reporting some left-sided chest discomfort.  Denies exertional worsening of symptoms or associated shortness of breath.  Patient also states that she noticed swelling to both of her ankles yesterday.  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  Denies any fever chills, cough, abdominal pain, nausea, vomiting, urinary symptoms, change in bowel habits.  Past medical history significant for substance use, OCD, POTS, migraine  Home Medications Prior to Admission medications   Medication Sig Start Date End Date Taking? Authorizing Provider  escitalopram (LEXAPRO) 20 MG tablet Take 1 tablet (20 mg total) by mouth daily. 05/03/23   Christen Butter, NP      Allergies    Bee venom, Shellfish allergy, Sulfa antibiotics, Amoxicillin, Azithromycin, and Naproxen    Review of Systems   Review of Systems  Cardiovascular:  Positive for palpitations.  All other systems reviewed and are negative.   Physical Exam Updated Vital Signs BP 117/64   Pulse 78   Temp 97.8 F (36.6 C) (Oral)   Resp 19   Ht 5\' 5"  (1.651 m)   Wt 104.3 kg   LMP 01/01/2024   SpO2 96%   BMI 38.27 kg/m  Physical Exam Vitals and nursing  note reviewed.  Constitutional:      General: She is not in acute distress.    Appearance: She is well-developed.  HENT:     Head: Normocephalic and atraumatic.  Eyes:     Conjunctiva/sclera: Conjunctivae normal.  Cardiovascular:     Rate and Rhythm: Normal rate and regular rhythm.     Pulses: Normal pulses.     Heart sounds: No murmur heard.    No gallop.  Pulmonary:     Effort: Pulmonary effort is normal. No respiratory distress.     Breath sounds: Normal breath sounds. No wheezing, rhonchi or rales.  Abdominal:     Palpations: Abdomen is soft.     Tenderness: There is no abdominal tenderness.  Musculoskeletal:     Cervical back: Neck supple.     Comments: 0-1+ edema bilateral ankles.  Skin:    General: Skin is warm and dry.     Capillary Refill: Capillary refill takes less than 2 seconds.  Neurological:     Mental Status: She is alert.  Psychiatric:        Mood and Affect: Mood normal.     ED Results / Procedures / Treatments   Labs (all labs ordered are listed, but only abnormal results are displayed) Labs Reviewed  COMPREHENSIVE METABOLIC PANEL - Abnormal; Notable for the following components:      Result Value   CO2 21 (*)    All other components within normal limits  BRAIN NATRIURETIC PEPTIDE  CBC  WITH DIFFERENTIAL/PLATELET  CBC WITH DIFFERENTIAL/PLATELET  TSH  TROPONIN I (HIGH SENSITIVITY)  TROPONIN I (HIGH SENSITIVITY)    EKG EKG Interpretation Date/Time:  Saturday January 05 2024 11:52:31 EST Ventricular Rate:  74 PR Interval:  175 QRS Duration:  91 QT Interval:  410 QTC Calculation: 455 R Axis:   6  Text Interpretation: Sinus rhythm Low voltage, precordial leads Abnormal R-wave progression, early transition No old tracing to compare Confirmed by Melene Plan [phone]) on 01/05/2024 4:52:23 PM  Radiology DG Chest 2 View Result Date: 01/05/2024 CLINICAL DATA:  48 year old female with history of chest pain and palpitations. EXAM: CHEST - 2 VIEW  COMPARISON:  No priors. FINDINGS: Lung volumes are normal. No consolidative airspace disease. No pleural effusions. No pneumothorax. No pulmonary nodule or mass noted. Pulmonary vasculature and the cardiomediastinal silhouette are within normal limits. Old healed fracture of the right mid clavicle incidentally noted. IMPRESSION: 1. No radiographic evidence of acute cardiopulmonary disease. Electronically Signed   By: Trudie Reed M.D.   On: 01/05/2024 12:25    Procedures Procedures    Medications Ordered in ED Medications - No data to display  ED Course/ Medical Decision Making/ A&P                                 Medical Decision Making Amount and/or Complexity of Data Reviewed Labs: ordered. Radiology: ordered.   This patient presents to the ED for concern of palpitations, this involves an extensive number of treatment options, and is a complaint that carries with it a high risk of complications and morbidity.  The differential diagnosis includes PE, ACS, anemia, sepsis, pneumonia, viral infection, CHF, arrhythmia, hyperthyroidism, medication side effect, substance use/withdrawal, other   Co morbidities that complicate the patient evaluation  See HPI   Additional history obtained:  Additional history obtained from EMR External records from outside source obtained and reviewed including hospital records   Lab Tests:  I Ordered, and personally interpreted labs.  The pertinent results include: No leukocytosis.  No evidence of anemia.  Platelets within range.  Mild decreased bicarb of 21 but otherwise, electrolytes within normal limits.  No transaminitis.  No renal dysfunction.  TSH pending.  BNP normal   Imaging Studies ordered:  I ordered imaging studies including chest x-ray I independently visualized and interpreted imaging which showed no acute cardiopulmonary abnormalities I agree with the radiologist interpretation   Cardiac Monitoring: / EKG:  The patient was  maintained on a cardiac monitor.  I personally viewed and interpreted the cardiac monitored which showed an underlying rhythm of: This rhythm   Consultations Obtained:  N/a   Problem List / ED Course / Critical interventions / Medication management  Palpitations Reevaluation of the patient showed that the patient stayed the same I have reviewed the patients home medicines and have made adjustments as needed   Social Determinants of Health:  Denies tobacco, illicit drug use   Test / Admission - Considered:  Palpitations Vitals signs within normal range and stable throughout visit. Laboratory/imaging studies significant for: See above 48 year old female presents emergency department with some palpitations and some left-sided chest discomfort.  Symptoms present since yesterday.  Patient complaining of palpitations, initial interview with heart rate in the normal range without evidence of arrhythmia, PVC/PAC or other abnormality.  Patient observed in the ED for further without fullness hours without appreciable arrhythmia on cardiac monitoring.  Patient with delta negative troponin, lack of  acute ischemic change on EKG; low suspicion for ACS.  Patient with Anner Crete PE score 0 and PERC negative; low suspicion for PE.  Chest x-ray without signs of pneumonia, pneumothorax or other acute cardiopulmonary abnormalities.  BNP normal, chest x-ray without obvious pulmonary vas congestion/provocation; low suspicion for CHF.  Patient now chest pain rating to back, pulse deficits, neurologic deficit, hypertension; low suspicion for aortic dissection.  Unsure exact etiology of patient's perceived palpitations but seems not to be of emergent pathology.  Will send TSH and recommend following MyChart.  Recommend follow-up with primary care for reassessment.  Treatment plan discussed at length with patient and she acknowledged understanding was agreeable to said plan.  Patient overall appearing, febrile in no acute  distress. Worrisome signs and symptoms were discussed with the patient, and the patient acknowledged understanding to return to the ED if noticed. Patient was stable upon discharge.          Final Clinical Impression(s) / ED Diagnoses Final diagnoses:  Palpitations    Rx / DC Orders ED Discharge Orders     None         Peter Garter, [place] 01/05/24 1832    Melene Plan, DO 01/05/24 [phone]

## 2024-01-05 NOTE — Discharge Instructions (Signed)
As discussed, workup today overall reassuring.  Heart enzymes were normal and EKG appeared normal.  It does not appear like you are having a heart attack.  While you are in the emergency department, there is no obvious abnormal heart rhythm appreciated on the monitor.  Chest x-ray is without signs of pneumonia, collapsed lung or other abnormality.  Will recommend follow-up with primary care/cardiology in the outpatient setting for reassessment of your symptoms.  Your thyroid testing is pending at this time; recommend following MyChart for results of testing.  Please do not hesitate to return to emergency department if the worrisome signs and symptoms we discussed become apparent.

## 2024-01-06 LAB — TSH: TSH: 1.041 u[IU]/mL (ref 0.350–4.500)

## 2024-01-08 ENCOUNTER — Ambulatory Visit (INDEPENDENT_AMBULATORY_CARE_PROVIDER_SITE_OTHER): Payer: Self-pay

## 2024-01-08 ENCOUNTER — Ambulatory Visit: Payer: BC Managed Care – PPO | Attending: Medical-Surgical

## 2024-01-08 ENCOUNTER — Ambulatory Visit (INDEPENDENT_AMBULATORY_CARE_PROVIDER_SITE_OTHER): Payer: BC Managed Care – PPO | Admitting: Medical-Surgical

## 2024-01-08 ENCOUNTER — Encounter: Payer: Self-pay | Admitting: Medical-Surgical

## 2024-01-08 VITALS — BP 103/67 | HR 80 | Resp 20 | Ht 65.0 in | Wt 232.2 lb

## 2024-01-08 DIAGNOSIS — R002 Palpitations: Secondary | ICD-10-CM

## 2024-01-08 DIAGNOSIS — R011 Cardiac murmur, unspecified: Secondary | ICD-10-CM | POA: Diagnosis not present

## 2024-01-08 DIAGNOSIS — Z09 Encounter for follow-up examination after completed treatment for conditions other than malignant neoplasm: Secondary | ICD-10-CM

## 2024-01-08 HISTORY — DX: Palpitations: R00.2

## 2024-01-08 NOTE — Progress Notes (Signed)
        Established patient visit  History, exam, impression, and plan:  1. Hospital discharge follow-up (Primary) 2. Palpitations Pleasant 48 year old female presenting today for hospital discharge follow-up for palpitations.  She was seen in the ED on 01/05/2024 after experiencing an increase in palpitations over 36 hours span.  Her workup was unremarkable and she was discharged without any change to medications or recommendations for follow-up outside of seeing her PCP.  Today she reports that palpitations have been a long-term issue for her.  Just before Thanksgiving, she noticed a worsening in her symptoms so she cut out most of her caffeine intake which helped.  Things slowly went back to normal until last week when she began to experience palpitations every 2 to 3 minutes for a 36-hour span.  Now she reports palpitations when lying down as well as throughout the day which is unusual for her pattern.  When she has palpitations, she has a sharp grabbing pain with sensation as if her heart stops beating and then restarts forcefully.  She experiences dizziness and feels off balance.  Had also noted lower extremity edema occurring more recently.  Not currently under the care of a cardiologist.  Last long-term monitor completed in 2012.  Last echocardiogram was completed before that.  Exam is benign with normal rate and rhythm.  Lungs CTA with even and unlabored respirations.  No peripheral edema.  With a change in pattern, plan for long-term monitor.  Discussed cardiovascular risk stratification.  She would like to proceed with a CT cardiac calcium score for further investigation. - LONG TERM MONITOR (3-14 DAYS); Future - CT CARDIAC SCORING (SELF PAY ONLY); Future  3. CARDIAC MURMUR History of a cardiac murmur that is reported to be a valve prolapse but we do not have record of this.  Since it has been so long and she has a change in symptoms, plan for updating echocardiogram today.  CT calcium scoring  as noted above. - ECHOCARDIOGRAM COMPLETE; Future - CT CARDIAC SCORING (SELF PAY ONLY); Future   Procedures performed this visit: None.  Return if symptoms worsen or fail to improve.  __________________________________ Thayer Ohm, DNP, APRN, FNP-BC Primary Care and Sports Medicine [facility] [place]

## 2024-01-08 NOTE — Progress Notes (Unsigned)
EP to read

## 2024-01-10 ENCOUNTER — Encounter: Payer: Self-pay | Admitting: Medical-Surgical

## 2024-01-11 DIAGNOSIS — R002 Palpitations: Secondary | ICD-10-CM | POA: Diagnosis not present

## 2024-01-25 ENCOUNTER — Encounter: Payer: Self-pay | Admitting: Medical-Surgical

## 2024-01-30 DIAGNOSIS — F419 Anxiety disorder, unspecified: Secondary | ICD-10-CM | POA: Insufficient documentation

## 2024-01-30 DIAGNOSIS — F1020 Alcohol dependence, uncomplicated: Secondary | ICD-10-CM | POA: Insufficient documentation

## 2024-01-30 DIAGNOSIS — F32A Depression, unspecified: Secondary | ICD-10-CM | POA: Insufficient documentation

## 2024-01-30 DIAGNOSIS — G90A Postural orthostatic tachycardia syndrome (POTS): Secondary | ICD-10-CM | POA: Insufficient documentation

## 2024-01-30 DIAGNOSIS — F1910 Other psychoactive substance abuse, uncomplicated: Secondary | ICD-10-CM | POA: Insufficient documentation

## 2024-01-30 DIAGNOSIS — F429 Obsessive-compulsive disorder, unspecified: Secondary | ICD-10-CM | POA: Insufficient documentation

## 2024-01-30 DIAGNOSIS — M1990 Unspecified osteoarthritis, unspecified site: Secondary | ICD-10-CM | POA: Insufficient documentation

## 2024-01-30 DIAGNOSIS — T7840XA Allergy, unspecified, initial encounter: Secondary | ICD-10-CM | POA: Insufficient documentation

## 2024-01-30 DIAGNOSIS — J3081 Allergic rhinitis due to animal (cat) (dog) hair and dander: Secondary | ICD-10-CM | POA: Insufficient documentation

## 2024-01-31 ENCOUNTER — Ambulatory Visit: Payer: BC Managed Care – PPO | Attending: Cardiology | Admitting: Cardiology

## 2024-01-31 ENCOUNTER — Encounter: Payer: Self-pay | Admitting: Cardiology

## 2024-01-31 VITALS — BP 118/80 | HR 71 | Ht 66.0 in | Wt 239.6 lb

## 2024-01-31 DIAGNOSIS — I4729 Other ventricular tachycardia: Secondary | ICD-10-CM | POA: Diagnosis not present

## 2024-01-31 DIAGNOSIS — E669 Obesity, unspecified: Secondary | ICD-10-CM | POA: Insufficient documentation

## 2024-01-31 DIAGNOSIS — R002 Palpitations: Secondary | ICD-10-CM | POA: Diagnosis not present

## 2024-01-31 NOTE — Progress Notes (Signed)
Cardiology Office Note:    Date:  01/31/2024   ID:  Chelsea Wells, DOB March 27, 1976, MRN [id number]  PCP:  Christen Butter, NP  Cardiologist:  Garwin Brothers, MD   Referring MD: Peter Garter, PA    ASSESSMENT:    1. Palpitations   2. Nonsustained ventricular tachycardia (HCC)   3. Obesity (BMI 35.0-39.9 without comorbidity)    PLAN:    In order of problems listed above:  Primary prevention stressed with the patient.  Importance of compliance with diet medication stressed and patient verbalized standing. Palpitations and nonsustained ventricular tachycardia: The patient was very abrasive and argumentative throughout the visit.  The patient repeatedly mentioned to me that I was not listening to the patient's history and concerns.  The patient's spouse was present in the room throughout the visit.  On multiple occasions I mentioned to the patient that I am listening to her history very carefully and asked the patient to repeat the history on multiple occasions just to make the patient feel that I was respecting the patient's sentiments.  However the patient was not convinced about this.  I also mentioned to the patient that the patient knows their body and symptoms best and that as a physician I am not here to judge the patient but to assist in making the patient feel better.  The patient's calcium score is 0 and I detailed this report to the patient.  I reassured about nonsustained ventricular tachycardia I wanted to get a magnesium level and also initiate the patient on a low-dose beta-blocker Toprol-XL 12.5 mg once daily.  The patient mentioned to me that this was absolutely rubbish and what I was saying to the patient did not make any sense.  Of course I gave the patient an option of not taking this medicine and that the patient could agree or not agree with my recommendations and I would fully respect  this.  However this did not help with the patient in any way and the patient refused  to listen to any of my suggestions and mentioned to me that the patient would not pay for this visit and would let the insurance company know not to pay for this visit.  I emphasized to the patient that I had reviewed the patient notes records hospitalization records extensively.  The patient walked out of the office.  I told [name] my nurse to call and discuss with the patient as to what the best we can do to evaluate the patient and do to make patient feel better but the patient did not really care much about getting any help from [place] and left the office.  [name] discussed this with our office manager Glenna and made her aware of the situation. Total time for my review of records and discussion with the patient and the aforementioned was 65 minutes.   Medication Adjustments/Labs and Tests Ordered: Current medicines are reviewed at length with the patient today.  Concerns regarding medicines are outlined above.  Orders Placed This Encounter  Procedures   EKG 12-Lead   No orders of the defined types were placed in this encounter.    History of Present Illness:    Chelsea Wells is a 48 y.o. female who is being seen today for the evaluation of palpitations and nonsustained ventricular tachycardia at the request of Peter Garter, [place].  Patient mentions to me that the patient has an abnormal sensation of feeling the heartbeat.  This happens more at  night.  Associated with brief palpitations like feeling.  No orthopnea PND or any dizziness.  No syncope.  At the time of my evaluation, the patient is alert awake oriented and in no distress.  Past Medical History:  Diagnosis Date   Alcoholism [facility])    recovering    Allergy    Anxiety    Arthritis    Bipolar 1 disorder, depressed, partial remission (HCC) 11/04/2018   CARDIAC MURMUR 08/18/2009   Qualifier: Diagnosis of   By: Manson Passey, RN, BSN, Lauren         Cat allergies    Chewing tobacco nicotine dependence without complication  11/04/2018   Depressed    Drug abuse (HCC)    Family history of colon cancer in mother 11/04/2018   Family history of rheumatoid arthritis 03/10/2021   Lightheadedness 08/10/2011   Migraine with aura and without status migrainosus, not intractable 04/29/2020   Moderate alcohol use disorder, in sustained remission (HCC) 11/04/2018   OCD (obsessive compulsive disorder)    Palpitations 01/08/2024   POTS (postural orthostatic tachycardia syndrome)    Shellfish allergy 11/04/2018    Past Surgical History:  Procedure Laterality Date   TONSILLECTOMY      Current Medications: Current Meds  Medication Sig   escitalopram (LEXAPRO) 20 MG tablet Take 1 tablet (20 mg total) by mouth daily.     Allergies:   Bee venom, Shellfish allergy, Sulfa antibiotics, Amoxicillin, Azithromycin, and Naproxen   Social History   Socioeconomic History   Marital status: Married    Spouse name: Not on file   Number of children: Not on file   Years of education: Not on file   Highest education level: Not on file  Occupational History   Occupation: Controller   Tobacco Use   Smoking status: Former    Current packs/day: 0.00    Average packs/day: 1 pack/day for 4.0 years (4.0 ttl pk-yrs)    Types: Cigarettes    Start date: 07/24/2017    Quit date: 07/24/2021    Years since quitting: 2.5   Smokeless tobacco: Current    Types: Chew   Tobacco comments:    quit 10/06  Vaping Use   Vaping status: Never Used  Substance and Sexual Activity   Alcohol use: Not Currently    Comment: sober since 2018   Drug use: No   Sexual activity: Yes    Partners: Female    Birth control/protection: Other-see comments, None    Comment: Gay  Other Topics Concern   Not on file  Social History Narrative   Media planner. Regularly exercises- swimming, biking. Restaurant manager, fast food. Caffeine.    Social Drivers of Corporate investment banker Strain: Not on file  Food Insecurity: Not on file  Transportation Needs: Not  on file  Physical Activity: Not on file  Stress: Not on file  Social Connections: Unknown (04/21/2022)   Received from [facility], [facility]   Social Network    Social Network: Not on file     Family History: The patient's family history includes Breast cancer in her paternal grandmother; Cancer in her mother; Colon cancer in her mother. There is no history of Esophageal cancer.  ROS:   Please see the history of present illness.    All other systems reviewed and are negative.  EKGs/Labs/Other Studies Reviewed:    The following studies were reviewed today:  EKG Interpretation Date/Time:  Thursday January 31 2024 08:40:35 EST Ventricular Rate:  71 PR Interval:  168  QRS Duration:  78 QT Interval:  424 QTC Calculation: 460 R Axis:   3  Text Interpretation: Normal sinus rhythm Normal ECG When compared with ECG of 05-Jan-2024 11:52, PREVIOUS ECG IS PRESENT Confirmed by Belva Crome [phone]) on 01/31/2024 9:44:53 AM     Recent Labs: 01/05/2024: ALT 29; B Natriuretic Peptide 15.2; BUN 14; Creatinine, Ser 0.60; Hemoglobin 12.4; Platelets 294; Potassium 3.9; Sodium 137; TSH 1.041  Recent Lipid Panel    Component Value Date/Time   CHOL 249 (H) 05/03/2023 1450   TRIG 151 (H) 05/03/2023 1450   HDL 98 05/03/2023 1450   CHOLHDL 2.5 05/03/2023 1450   LDLCALC 125 (H) 05/03/2023 1450    Physical Exam:    VS:  BP 118/80   Pulse 71   Ht 5\' 6"  (1.676 m)   Wt 239 lb 9.6 oz (108.7 kg)   LMP 01/01/2024   SpO2 97%   BMI 38.67 kg/m     Wt Readings from Last 3 Encounters:  01/31/24 239 lb 9.6 oz (108.7 kg)  01/08/24 232 lb 3.2 oz (105.3 kg)  08/18/23 210 lb (95.3 kg)     GEN: Patient is in no acute distress HEENT: Normal NECK: No JVD; No carotid bruits LYMPHATICS: No lymphadenopathy CARDIAC: S1 S2 regular, 2/6 systolic murmur at the apex. RESPIRATORY:  Clear to auscultation without rales, wheezing or rhonchi  ABDOMEN: Soft, non-tender, non-distended MUSCULOSKELETAL:  No  edema; No deformity  SKIN: Warm and dry NEUROLOGIC:  Alert and oriented x 3 PSYCHIATRIC:  Normal affect    Signed, Garwin Brothers, MD  01/31/2024 10:58 AM    [facility]

## 2024-01-31 NOTE — Progress Notes (Signed)
Attempted to speak with pt to see how we could help her and what she wanted out of the visit as she did not feel Dr. Tomie China was listening to her. Pt refused to tell me anything and stated this is "rubbish". Pt also states that I can get his side of the story, I stated that I am here to help you and understand what you are wanting out of the visit. Pt again refused to tell me anything and states that this is rubbish and you can read it in my review. Pt states that she is not paying for this visit nor will she let her insurance pay for the visit. As patient is leaving the room and throughout the conversation the person in the room with the pt is shaking her head and mouthing the words " I am sorry, I am so sorry."

## 2024-02-01 ENCOUNTER — Encounter: Payer: Self-pay | Admitting: Cardiology

## 2024-02-05 ENCOUNTER — Ambulatory Visit (HOSPITAL_BASED_OUTPATIENT_CLINIC_OR_DEPARTMENT_OTHER)
Admission: RE | Admit: 2024-02-05 | Discharge: 2024-02-05 | Disposition: A | Discharge status: Home / Self Care | Payer: BC Managed Care – PPO | Source: Ambulatory Visit | Attending: Medical-Surgical | Admitting: Medical-Surgical

## 2024-02-05 ENCOUNTER — Encounter: Payer: Self-pay | Admitting: Medical-Surgical

## 2024-02-05 DIAGNOSIS — R011 Cardiac murmur, unspecified: Secondary | ICD-10-CM

## 2024-02-05 DIAGNOSIS — R002 Palpitations: Secondary | ICD-10-CM

## 2024-02-05 DIAGNOSIS — E669 Obesity, unspecified: Secondary | ICD-10-CM

## 2024-02-05 LAB — ECHOCARDIOGRAM COMPLETE
AR max vel: 2.41 cm2
AV Area VTI: 2.45 cm2
AV Area mean vel: 2.35 cm2
AV Mean grad: 3 mm[Hg]
AV Peak grad: 5 mm[Hg]
Ao pk vel: 1.12 m/s
Area-P 1/2: 3.12 cm2
Calc EF: 61.7 %
MV M vel: 3.08 m/s
MV Peak grad: 37.9 mm[Hg]
S' Lateral: 2.8 cm
Single Plane A2C EF: 63.7 %
Single Plane A4C EF: 60.2 %

## 2024-02-11 NOTE — Addendum Note (Signed)
 Addended byChristen Butter on: 02/11/2024 08:57 PM   Modules accepted: Orders

## 2024-04-12 ENCOUNTER — Other Ambulatory Visit: Payer: Self-pay | Admitting: Medical-Surgical

## 2024-09-09 ENCOUNTER — Ambulatory Visit (INDEPENDENT_AMBULATORY_CARE_PROVIDER_SITE_OTHER): Admitting: Medical-Surgical

## 2024-09-09 ENCOUNTER — Encounter: Payer: Self-pay | Admitting: Medical-Surgical

## 2024-09-09 VITALS — BP 112/75 | HR 86 | Temp 98.4°F | Resp 18 | Ht 66.0 in | Wt 243.0 lb

## 2024-09-09 DIAGNOSIS — Z0000 Encounter for general adult medical examination without abnormal findings: Secondary | ICD-10-CM | POA: Diagnosis not present

## 2024-09-09 MED ORDER — ESCITALOPRAM OXALATE 20 MG PO TABS: 20.0000 mg | ORAL_TABLET | Freq: Every day | ORAL | 3 refills | Status: AC

## 2024-09-09 NOTE — Patient Instructions (Signed)
 Preventive Care 46-46 Years Old, Female  Preventive care refers to lifestyle choices and visits with your health care provider that can promote health and wellness. Preventive care visits are also called wellness exams.  What can I expect for my preventive care visit?  Counseling  Your health care provider may ask you questions about your:  Medical history, including:  Past medical problems.  Family medical history.  Pregnancy history.  Current health, including:  Menstrual cycle.  Method of birth control.  Emotional well-being.  Home life and relationship well-being.  Sexual activity and sexual health.  Lifestyle, including:  Alcohol, nicotine or tobacco, and drug use.  Access to firearms.  Diet, exercise, and sleep habits.  Work and work Astronomer.  Sunscreen use.  Safety issues such as seatbelt and bike helmet use.  Physical exam  Your health care provider will check your:  Height and weight. These may be used to calculate your BMI (body mass index). BMI is a measurement that tells if you are at a healthy weight.  Waist circumference. This measures the distance around your waistline. This measurement also tells if you are at a healthy weight and may help predict your risk of certain diseases, such as type 2 diabetes and high blood pressure.  Heart rate and blood pressure.  Body temperature.  Skin for abnormal spots.  What immunizations do I need?    Vaccines are usually given at various ages, according to a schedule. Your health care provider will recommend vaccines for you based on your age, medical history, and lifestyle or other factors, such as travel or where you work.  What tests do I need?  Screening  Your health care provider may recommend screening tests for certain conditions. This may include:  Lipid and cholesterol levels.  Diabetes screening. This is done by checking your blood sugar (glucose) after you have not eaten for a while (fasting).  Pelvic exam and Pap test.  Hepatitis B test.  Hepatitis C  test.  HIV (human immunodeficiency virus) test.  STI (sexually transmitted infection) testing, if you are at risk.  Lung cancer screening.  Colorectal cancer screening.  Mammogram. Talk with your health care provider about when you should start having regular mammograms. This may depend on whether you have a family history of breast cancer.  BRCA-related cancer screening. This may be done if you have a family history of breast, ovarian, tubal, or peritoneal cancers.  Bone density scan. This is done to screen for osteoporosis.  Talk with your health care provider about your test results, treatment options, and if necessary, the need for more tests.  Follow these instructions at home:  Eating and drinking    Eat a diet that includes fresh fruits and vegetables, whole grains, lean protein, and low-fat dairy products.  Take vitamin and mineral supplements as recommended by your health care provider.  Do not drink alcohol if:  Your health care provider tells you not to drink.  You are pregnant, may be pregnant, or are planning to become pregnant.  If you drink alcohol:  Limit how much you have to 0-1 drink a day.  Know how much alcohol is in your drink. In the [place] one drink equals one 12 oz bottle of beer (355 mL), one 5 oz glass of wine (148 mL), or one 1 oz glass of hard liquor (44 mL).  Lifestyle  Brush your teeth every morning and night with fluoride toothpaste. Floss one time each day.  Exercise for at least  30 minutes 5 or more days each week.  Do not use any products that contain nicotine or tobacco. These products include cigarettes, chewing tobacco, and vaping devices, such as e-cigarettes. If you need help quitting, ask your health care provider.  Do not use drugs.  If you are sexually active, practice safe sex. Use a condom or other form of protection to prevent STIs.  If you do not wish to become pregnant, use a form of birth control. If you plan to become pregnant, see your health care provider for a  prepregnancy visit.  Take aspirin only as told by your health care provider. Make sure that you understand how much to take and what form to take. Work with your health care provider to find out whether it is safe and beneficial for you to take aspirin daily.  Find healthy ways to manage stress, such as:  Meditation, yoga, or listening to music.  Journaling.  Talking to a trusted person.  Spending time with friends and family.  Minimize exposure to UV radiation to reduce your risk of skin cancer.  Safety  Always wear your seat belt while driving or riding in a vehicle.  Do not drive:  If you have been drinking alcohol. Do not ride with someone who has been drinking.  When you are tired or distracted.  While texting.  If you have been using any mind-altering substances or drugs.  Wear a helmet and other protective equipment during sports activities.  If you have firearms in your house, make sure you follow all gun safety procedures.  Seek help if you have been physically or sexually abused.  What's next?  Visit your health care provider once a year for an annual wellness visit.  Ask your health care provider how often you should have your eyes and teeth checked.  Stay up to date on all vaccines.  This information is not intended to replace advice given to you by your health care provider. Make sure you discuss any questions you have with your health care provider.  Document Revised: 06/01/2021 Document Reviewed: 06/01/2021  Elsevier Patient Education  2024 [employer].

## 2024-09-09 NOTE — Progress Notes (Signed)
 Complete physical exam  Patient: Chelsea Wells   DOB: 12-29-1975   48 y.o. Female  MRN: [id number]  Subjective:    Chief Complaint  Patient presents with   Annual Exam    Chelsea Wells is a 48 y.o. female who presents today for a complete physical exam. She reports consuming a gluten free diet. The patient does not participate in regular exercise at present. She generally feels fairly well. She reports sleeping well. She does not have additional problems to discuss today.    Most recent fall risk assessment:    09/09/2024    3:54 PM  Fall Risk   Falls in the past year? 0  Number falls in past yr: 0  Injury with Fall? 0  Risk for fall due to : No Fall Risks  Follow up Falls evaluation completed     Most recent depression screenings:    09/09/2024    3:54 PM 05/03/2023    2:14 PM  PHQ 2/9 Scores  PHQ - 2 Score 0 0  PHQ- 9 Score 0     Vision:Within last year and Dental: No current dental problems and Receives regular dental care    Patient Care Team: Willo Mini, NP as PCP - General (Nurse Practitioner) Debby Wylene JINNY., MD as Referring Physician (Internal Medicine)   Outpatient Medications Prior to Visit  Medication Sig   [DISCONTINUED] escitalopram  (LEXAPRO ) 20 MG tablet Take 1 tablet (20 mg total) by mouth daily. NEEDS APPOINTMENT FOR FURTHER REFILLS.   No facility-administered medications prior to visit.    Review of Systems  Constitutional:  Negative for chills, fever, malaise/fatigue and weight loss.  HENT:  Negative for congestion, ear pain, hearing loss, sinus pain and sore throat.   Eyes:  Negative for blurred vision, photophobia and pain.  Respiratory:  Negative for cough, shortness of breath and wheezing.   Cardiovascular:  Positive for palpitations. Negative for chest pain and leg swelling.  Gastrointestinal:  Negative for abdominal pain, constipation, diarrhea, heartburn, nausea and vomiting.  Genitourinary:  Negative for dysuria,  frequency and urgency.  Musculoskeletal:  Positive for joint pain. Negative for falls and neck pain.  Skin:  Negative for itching and rash.  Neurological:  Negative for dizziness, weakness and headaches.  Endo/Heme/Allergies:  Negative for polydipsia. Does not bruise/bleed easily.  Psychiatric/Behavioral:  Negative for depression, substance abuse and suicidal ideas. The patient is not nervous/anxious and does not have insomnia.      Objective:     BP 112/75   Pulse 86   Temp 98.4 F (36.9 C) (Oral)   Resp 18   Ht 5' 6 (1.676 m)   Wt 243 lb (110.2 kg)   SpO2 97%   BMI 39.22 kg/m    Physical Exam Constitutional:      General: She is not in acute distress.    Appearance: Normal appearance. She is obese. She is not ill-appearing.  HENT:     Head: Normocephalic and atraumatic.     Right Ear: Tympanic membrane, ear canal and external ear normal. There is no impacted cerumen.     Left Ear: Tympanic membrane, ear canal and external ear normal. There is no impacted cerumen.     Nose: Nose normal. No congestion or rhinorrhea.     Mouth/Throat:     Mouth: Mucous membranes are moist.     Pharynx: No oropharyngeal exudate or posterior oropharyngeal erythema.  Eyes:     General: No scleral icterus.  Right eye: No discharge.        Left eye: No discharge.     Extraocular Movements: Extraocular movements intact.     Conjunctiva/sclera: Conjunctivae normal.     Pupils: Pupils are equal, round, and reactive to light.  Neck:     Thyroid : No thyromegaly.     Vascular: No carotid bruit or JVD.     Trachea: Trachea normal.  Cardiovascular:     Rate and Rhythm: Normal rate and regular rhythm.     Pulses: Normal pulses.     Heart sounds: Normal heart sounds. No murmur heard.    No friction rub. No gallop.  Pulmonary:     Effort: Pulmonary effort is normal. No respiratory distress.     Breath sounds: Normal breath sounds. No wheezing.  Abdominal:     General: Bowel sounds are  normal. There is no distension.     Palpations: Abdomen is soft.     Tenderness: There is no abdominal tenderness. There is no guarding.  Musculoskeletal:        General: Normal range of motion.     Cervical back: Normal range of motion and neck supple.  Lymphadenopathy:     Cervical: No cervical adenopathy.  Skin:    General: Skin is warm and dry.  Neurological:     Mental Status: She is alert and oriented to person, place, and time.     Cranial Nerves: No cranial nerve deficit.  Psychiatric:        Mood and Affect: Mood normal.        Behavior: Behavior normal.        Thought Content: Thought content normal.        Judgment: Judgment normal.      No results found for any visits on 09/09/24.     Assessment & Plan:    Routine Health Maintenance and Physical Exam  Immunization History  Administered Date(s) Administered   Hepatitis B, PED/ADOLESCENT 05/14/1996, 06/16/1996, 12/08/1996   Janssen (J&J) SARS-COV-2 Vaccination 10/28/2020   Rabies, IM 10/29/2018, 10/31/2018   Tdap 10/29/2018    Health Maintenance  Topic Date Due   Cervical Cancer Screening (HPV/Pap Cotest)  Never done   Mammogram  Never done   Influenza Vaccine  03/17/2025 (Originally 07/18/2024)   Hepatitis C Screening  09/09/2025 (Originally 09/04/1996)   HIV Screening  09/09/2025 (Originally 09/04/1993)   Colonoscopy  07/27/2025   DTaP/Tdap/Td (2 - Td or Tdap) 10/29/2028   Hepatitis B Vaccines 19-59 Average Risk  Completed   Pneumococcal Vaccine  Aged Out   HPV VACCINES  Aged Out   Meningococcal B Vaccine  Aged Out   COVID-19 Vaccine  Discontinued    Discussed health benefits of physical activity, and encouraged her to engage in regular exercise appropriate for her age and condition.  1. Annual physical exam (Primary) Checking labs as below. UTD on vision and dental care.  Overdue on mammogram and cervical cancer screening however she declined today.  Wellness information provided with AVS. - CBC with  Differential/Platelet - CMP14+EGFR - Lipid panel   Return in about 1 year (around 09/09/2025) for annual physical exam or sooner if needed.     Jilian West, NP

## 2024-09-10 ENCOUNTER — Ambulatory Visit: Payer: Self-pay | Admitting: Medical-Surgical

## 2024-09-10 LAB — CMP14+EGFR
ALT: 25 IU/L (ref 0–32)
AST: 15 IU/L (ref 0–40)
Albumin: 4.2 g/dL (ref 3.9–4.9)
Alkaline Phosphatase: 62 IU/L (ref 41–116)
BUN/Creatinine Ratio: 12 (ref 9–23)
BUN: 8 mg/dL (ref 6–24)
Bilirubin Total: <0.2 mg/dL (ref 0.0–1.2)
CO2: 21 mmol/L (ref 20–29)
Calcium: 9.1 mg/dL (ref 8.7–10.2)
Chloride: 103 mmol/L (ref 96–106)
Creatinine, Ser: 0.66 mg/dL (ref 0.57–1.00)
Globulin, Total: 2.6 g/dL (ref 1.5–4.5)
Glucose: 81 mg/dL (ref 70–99)
Potassium: 4.3 mmol/L (ref 3.5–5.2)
Sodium: 138 mmol/L (ref 134–144)
Total Protein: 6.8 g/dL (ref 6.0–8.5)
eGFR: 109 mL/min/1.73 (ref >59)

## 2024-09-10 LAB — CBC WITH DIFFERENTIAL/PLATELET
Basophils Absolute: 0.1 x10E3/uL (ref 0.0–0.2)
Basos: 1 %
EOS (ABSOLUTE): 0.2 x10E3/uL (ref 0.0–0.4)
Eos: 2 %
Hematocrit: 37.4 % (ref 34.0–46.6)
Hemoglobin: 12.2 g/dL (ref 11.1–15.9)
Immature Grans (Abs): 0 x10E3/uL (ref 0.0–0.1)
Immature Granulocytes: 0 %
Lymphocytes Absolute: 2.1 x10E3/uL (ref 0.7–3.1)
Lymphs: 27 %
MCH: 28.3 pg (ref 26.6–33.0)
MCHC: 32.6 g/dL (ref 31.5–35.7)
MCV: 87 fL (ref 79–97)
Monocytes Absolute: 0.7 x10E3/uL (ref 0.1–0.9)
Monocytes: 8 %
Neutrophils Absolute: 5 x10E3/uL (ref 1.4–7.0)
Neutrophils: 62 %
Platelets: 328 x10E3/uL (ref 150–450)
RBC: 4.31 x10E6/uL (ref 3.77–5.28)
RDW: 13.4 % (ref 11.7–15.4)
WBC: 8 x10E3/uL (ref 3.4–10.8)

## 2024-09-10 LAB — LIPID PANEL
Chol/HDL Ratio: 2.7 ratio (ref 0.0–4.4)
Cholesterol, Total: 188 mg/dL (ref 100–199)
HDL: 69 mg/dL (ref >39)
LDL Chol Calc (NIH): 103 mg/dL — ABNORMAL HIGH (ref 0–99)
Triglycerides: 89 mg/dL (ref 0–149)
VLDL Cholesterol Cal: 16 mg/dL (ref 5–40)

## 2024-09-10 LAB — SPECIMEN STATUS REPORT
